# Patient Record
Sex: Male | Born: 1965 | Race: Black or African American | Hispanic: No | Marital: Married | Smoking: Current every day smoker
Health system: Southern US, Community
[De-identification: ages and names within clinical notes are randomized; demographics above are authoritative.]

## PROBLEM LIST (undated history)

## (undated) DIAGNOSIS — K859 Acute pancreatitis without necrosis or infection, unspecified: Secondary | ICD-10-CM

## (undated) HISTORY — PX: NO PAST SURGERIES: SHX2092

---

## 2013-06-11 ENCOUNTER — Emergency Department (INDEPENDENT_AMBULATORY_CARE_PROVIDER_SITE_OTHER)
Admission: EM | Admit: 2013-06-11 | Discharge: 2013-06-11 | Disposition: A | Discharge status: Home / Self Care | Payer: Self-pay | Source: Home / Self Care | Attending: Emergency Medicine | Admitting: Emergency Medicine

## 2013-06-11 ENCOUNTER — Encounter (HOSPITAL_COMMUNITY): Payer: Self-pay | Admitting: Emergency Medicine

## 2013-06-11 DIAGNOSIS — I10 Essential (primary) hypertension: Secondary | ICD-10-CM

## 2013-06-11 LAB — POCT I-STAT, CHEM 8
BUN: 10 mg/dL (ref 6–23)
Calcium, Ion: 1.02 mmol/L — ABNORMAL LOW (ref 1.12–1.23)
Chloride: 105 meq/L (ref 96–112)
Creatinine, Ser: 1.2 mg/dL (ref 0.50–1.35)
Glucose, Bld: 110 mg/dL — ABNORMAL HIGH (ref 70–99)
HCT: 53 % — ABNORMAL HIGH (ref 39.0–52.0)
Hemoglobin: 18 g/dL — ABNORMAL HIGH (ref 13.0–17.0)
Potassium: 3.7 meq/L (ref 3.7–5.3)
Sodium: 140 meq/L (ref 137–147)
TCO2: 26 mmol/L (ref 0–100)

## 2013-06-11 MED ORDER — HYDROCHLOROTHIAZIDE 25 MG PO TABS: 25.0000 mg | ORAL_TABLET | Freq: Every day | ORAL | Status: DC

## 2013-06-11 MED ORDER — AMLODIPINE BESYLATE 5 MG PO TABS
5.0000 mg | ORAL_TABLET | Freq: Every day | ORAL | Status: DC
Start: 2013-06-11 — End: 2014-06-20

## 2013-06-11 NOTE — ED Provider Notes (Signed)
Chief Complaint   Chief Complaint  Patient presents with  . 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 He was given a three-month extension to get his blood sugars under control, and is 2 months into the three-month extension, so he needs something now to get his blood pressure is controlled. He's not taking any medication right now. He denies any symptoms such as headaches, dizziness, lightheadedness, blurry vision, chest discomfort, or shortness of breath. He's had no palpitations or syncope. No extremity swelling or strokelike symptoms. He has no history of diabetes, elevated cholesterol, but he does smoke a half pack of cigarettes a day and drinks rare alcohol.  Review of Systems   Other than as noted above, the patient denies any of the following symptoms: Respiratory:  No coughing, wheezing, or shortness of breath. Cardiac:  No chest pain, tightness, pressure, palpitations, syncope, or edema. Neuro:  No headache, dizziness, blurred vision, weakness, paresthesias, or strokelike symptoms.   PMFSH   Past medical history, family history, social history, meds, and allergies were reviewed.    Physical Examination   Vital signs:  BP 162/83  Pulse 78  Temp(Src) 98.1 F (36.7 C) (Oral)  Resp 18  SpO2 98% General:  Alert, oriented, in no distress. Lungs:  Breath sounds clear and equal bilaterally.  No wheezes, rales, or rhonchi. Heart:  Regular rhythm, no gallops, murmers, clicks or rubs.  Abdomen:  Soft and flat.  Nontender, no organomegaly or mass.  No pulsatile midline abdominal mass or bruit. Ext:  No edema, pulses full. Neurological exam:  Alert and oriented.  Speech is clear.  No pronator drift.  CNs intact.  Labs   Results for orders placed during the hospital encounter of 06/11/13  POCT I-STAT, CHEM 8   Result Value Ref Range   Sodium 140  137 - 147 mEq/L   Potassium 3.7  3.7 - 5.3 mEq/L   Chloride 105  96 - 112 mEq/L   BUN 10  6 - 23 mg/dL   Creatinine, Ser 8.291.20  0.50 - 1.35 mg/dL   Glucose, Bld 562110 (*) 70 - 99 mg/dL   Calcium, Ion 1.301.02 (*) 1.12 - 1.23 mmol/L   TCO2 26  0 - 100 mmol/L   Hemoglobin 18.0 (*) 13.0 - 17.0 g/dL   HCT 86.553.0 (*) 78.439.0 - 69.652.0 %    Assessment   The encounter diagnosis was Hypertension.  He'll need a primary care physician and was given the name of the [facility] for followup within the next month.  Plan   1.  Meds:  The following meds were prescribed:   Discharge Medication List as of 06/11/2013  8:06 PM    START taking these medications   Details  amLODipine (NORVASC) 5 MG tablet Take 1 tablet (5 mg total) by mouth daily., Starting 06/11/2013, Until Discontinued, Normal    hydrochlorothiazide (HYDRODIURIL) 25 MG tablet Take 1 tablet (25 mg total) by mouth daily., Starting 06/11/2013, Until Discontinued, Normal        2.  Patient Education/Counseling:  The patient was given appropriate handouts, self care instructions, and instructed in symptomatic relief. Specifically discussed salt and sodium restriction, weight control, and exercise.   3.  Follow up:  The patient was told to follow up here if no better  in 3 to 4 days, or sooner if becoming worse in any way, and given some red flag symptoms such as severe headache, vision changes, shortness of breath, chest pain or stroke like symptoms which would prompt immediate return.      Reuben Likes, MD 06/11/13 [phone]

## 2013-06-11 NOTE — ED Notes (Signed)
Had a DOT exam about 6 weeks ago, and his BP was elevated then. 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

## 2013-06-11 NOTE — Discharge Instructions (Signed)
Blood pressure over the ideal can put you at higher risk for stroke, heart disease, and kidney failure.  For this reason, it's important to try to get your blood pressure as close as possible to the ideal. ° °The ideal blood pressure is 120/80.  Blood pressures from 120-139 systolic over 80-89 diastolic are labeled as "prehypertension."  This means you are at higher risk of developing hypertension in the future.  Blood pressures in this range are not treated with medication, but lifestyle changes are recommended to prevent progression to hypertension.  Blood pressures of 140 and above systolic over 90 and above diastolic are classified as hypertension and are treated with medications. ° °Lifestyle changes which can benefit both prehypertension and hypertension include the following: ° °· Salt and sodium restriction. °· Weight loss. °· Regular exercise. °· Avoidance of tobacco. °· Avoidance of excess alcohol. °· The "D.A.S.H" diet. ° °· People with hypertension and prehypertension should limit their salt intake to less than 1500 mg daily.  Reading the nutrition information on the label of many prepared foods can give you an idea of how much sodium you're consuming at each meal.  Remember that the most important number on the nutrition information is the serving size.  It may be smaller than you think.  Try to avoid adding extra salt at the table.  You may add small amounts of salt while cooking.  Remember that salt is an acquired taste and you may get used to a using a whole lot less salt than you are using now.  Using less salt lets the food's natural flavors come through.  You might want to consider using salt substitutes, potassium chloride, pepper, or blends of herbs and spices to enhance the flavor of your food.  Foods that contain the most salt include: processed meats (like ham, bacon, lunch meat, sausage, hot dogs, and breakfast meat), chips, pretzels, salted nuts, soups, salty snacks, canned foods, junk  food, fast food, restaurant food, mustard, pickles, pizza, popcorn, soy sauce, and worcestershire sauce--quite a list!  You might ask, "Is there anything I can eat?"  The answer is, "yes."  Fruits and vegetables are usually low in salt.  Fresh is better than frozen which is better than canned.  If you have canned vegetables, you can cut down on the salt content by rinsing them in tap water 3 times before cooking.   ° °· Weight loss is the second thing you can do to lower your blood pressure.  Getting to and maintaining ideal weight will often normalize your blood pressure and allow you to avoid medications, entirely, cut way down on your dosage of medications, or allow to wean off your meds.  (Note, this should only be done under the supervision of your primary care doctor.)  Of course, weight loss takes time and you may need to be on medication in the meantime.  You shoot for a body mass index of 20-25.  When you go to the urgent care or to your primary care doctor, they should calculate your BMI.  If you don't know what it is, ask.  You can calculate your BMI with the following formula:  Weight in pounds x 703/ (height in inches) x (height in inches).  There are many good diets out there: Weight Watchers and the D.A.S.H. Diet are the best, but often, just modifying a few factors can be helpful:  Don't skip meals, don't eat out, and keeping a food diary.  I do not recommend   fad diets or diet pills which often raise blood pressure.  ° °· Everyone should get regular exercise, but this is particularly important for people with high blood pressure.  Just about any exercise is good.  The only exercise which may be harmful is lifting extreme heavy weights.  I recommend moderate exercise such as walking for 30 minutes 5 days a week.  Going to the gym for a 50 minute workout 3 times a week is also good.  This amounts to 150 minutes of exercise weekly. ° °· Anyone with high blood pressure should avoid any use of tobacco.   Tobacco use does not elevate blood pressure, but it increases the risk of heart disease and stroke.  If you are interested in quitting, discuss with your doctor how to quit.  If you are not interested in quitting, ask yourself, "What would my life be like in 10 years if I continue to smoke?"  "How will I know when it is time to quit?"  "How would my life be better if I were to quit." ° °· Excess alcohol intake can raise the blood pressure.  The safe alcohol intake is 2 drinks or less per day for men and 1 drink per day or less for women. ° °· There is a very good diet which I recommend that has been designed for people with blood pressure called the D.A.S.H. Diet (dietary approaches to stop hypertension).  It consists of fruits, vegetables, lean meats, low fat dairy, whole grains, nuts and seeds.  It is very low in salt and sodium.  It has also been found to have other beneficial health effects such as lowering cholesterol and helping lose weight.  It has been developed by the National [employer] of [employer] and can be downloaded from the internet without any cost. Just do a web search on "D.A.S.H. Diet." or go the NIH website (www.nih.gov).  There are also cookbooks and diet plans that can be gotten from [employer] to help you with this diet. °·  °

## 2013-06-28 ENCOUNTER — Ambulatory Visit: Payer: Self-pay | Admitting: Emergency Medicine

## 2013-06-28 VITALS — BP 118/78 | HR 78 | Temp 97.8°F | Resp 18 | Ht 68.5 in | Wt 243.2 lb

## 2013-06-28 DIAGNOSIS — Z0000 Encounter for general adult medical examination without abnormal findings: Secondary | ICD-10-CM

## 2013-06-28 DIAGNOSIS — I10 Essential (primary) hypertension: Secondary | ICD-10-CM

## 2013-06-28 NOTE — Progress Notes (Signed)
   Subjective:    Patient ID: Tommy Payne, male    DOB: February 05, 1966, 48 y.o.   MRN: [id number]  HPI Chief Complaint  Patient presents with  . Annual Exam    DOT exam   This chart was scribed for Lesle ChrisSteven Keiarra Charon, MD by Andrew Auaven Small, ED Scribe. This patient was seen in room 8 and the patient's care was started at 3:02 PM.  HPI Comments: Tommy Payne is a 48 y.o. male who presents to the [facility] requesting a  DOT physical. Pt denies any medical issues. Pt reports that he has been taking norvasc and hydrodiuril for HTN for about 1 month. He reports otherwise he is healthy.  No past medical history on file. No Known Allergies Prior to Admission medications   Medication Sig Start Date End Date Taking? Authorizing Provider  amLODipine (NORVASC) 5 MG tablet Take 1 tablet (5 mg total) by mouth daily. 06/11/13  Yes Reuben Likesavid C Keller, MD  hydrochlorothiazide (HYDRODIURIL) 25 MG tablet Take 1 tablet (25 mg total) by mouth daily. 06/11/13  Yes Reuben Likesavid C Keller, MD    Review of Systems     Objective:   Physical Exam CONSTITUTIONAL: African American male Well developed/well nourished/non-distressed HEAD: Normocephalic/atraumatic. Gold upper incisors EYES: EOMI/PERRL ENMT: Mucous membranes moist NECK: supple no meningeal signs SPINE:entire spine nontender CV: S1/S2 noted, no murmurs/rubs/gallops noted LUNGS: Lungs are clear to auscultation bilaterally, no apparent distress ABDOMEN: soft, nontender, no rebound or guarding GU:no cva tenderness. 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 He is currently on a diuretic and amlodipine and has normal blood pressure on his check today. I personally performed the services described in this documentation, which was scribed  in my presence. The recorded information has been reviewed and is accurate.

## 2014-06-20 ENCOUNTER — Ambulatory Visit (INDEPENDENT_AMBULATORY_CARE_PROVIDER_SITE_OTHER): Payer: Self-pay | Admitting: Physician Assistant

## 2014-06-20 VITALS — BP 134/80 | HR 81 | Temp 97.9°F | Resp 18 | Ht 67.0 in | Wt 248.0 lb

## 2014-06-20 DIAGNOSIS — Z029 Encounter for administrative examinations, unspecified: Secondary | ICD-10-CM

## 2014-06-20 DIAGNOSIS — Z024 Encounter for examination for driving license: Secondary | ICD-10-CM

## 2014-06-20 NOTE — Progress Notes (Signed)
This patient presents for DOT examination for fitness for duty.  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  Medical History: no  Any illness or injury in the last 5 years? no  Head/Brain Injuries, disorders or illnesses no  Seizures, epilepsy no  Eye disorders or impaired vision  no  Ear disorders, loss of hearing or balance no  Heart disease or heart attack; other cardiovascular condition no  Heart surgery (valve replacement/bypass, angioplasty, pacemaker) no  High blood pressure no  Muscular disease no  Shortness of breath no  Lung disease, emphysema, asthma, chronic bronchitis no  Kidney disease, dialysis no  Liver disease no  Digestive problems no  Diabetes or elevated blood sugar no  Nervious or psychiatric disorders, e.g., severe depression no  Loss of, or altered consciousness no  Fainting, dizziness no  Sleep disorders, pauses in breathing while asleep, daytime sleepiness, loud snoring no  Stroke or paralysis no  Missing or impaired hand, arm, foot, leg, finger, toe no  Spinal injury or disease no  Chronic low back pain no  Regular, frequent alcohol use no  Narcotic or habit forming drug use  Current Medications: Prior to Admission medications   Medication Sig Start Date End Date Taking? Authorizing Provider  amLODipine (NORVASC) 5 MG tablet Take 1 tablet (5 mg total) by mouth daily. Patient not taking: Reported on 06/20/2014 06/11/13   Reuben Likesavid C Keller, MD  hydrochlorothiazide (HYDRODIURIL) 25 MG tablet Take 1 tablet (25 mg total) by mouth daily. Patient not taking: Reported on 06/20/2014 06/11/13   Reuben Likesavid C Keller, MD    Primary Care Provider: none Specialists:   Medical Examiner's Comments on Health History:    TESTING:   Visual Acuity Screening   Right eye Left eye Both eyes  Without correction: 20/15 -2 20/20 20/15 -1  With correction:       Monocular Vision: No.  Hearing Aid used for test: No. Hearing Aid required to to meet standard: No. Distance from  individual at which forced whispered voice can first be heard:   RIGHT ear 10 feet; LEFT ear 10 feet   BP 134/80 mmHg  Pulse 81  Temp(Src) 97.9 F (36.6 C) (Oral)  Resp 18  Ht 5\' 7"  (1.702 m)  Wt 248 lb (112.492 kg)  BMI 38.83 kg/m2  SpO2 98% Pulse rate is regular  Urine Specimen: Specific Gravity 1.010, Protein trace, Blood neg, Sugar neg   PHYSICAL EXAMINATION:  1. No. General Appearance: Marked overweight, tremor, signs of alcoholism, problem drinking or drug abuse. 2. No. Eyes: pupillary equality, reaction to light, accommodation, ocular motility, ocular muscle imbalance, extra ocular movement, nystagmus, exopthalmos. Ask about retinopathy, cataracts, aphakia, glaucoma, macular degeneration and refer to a specialist if appropriate.  3. No. Ears: Scarring of tympanic membrane, occlusion of external canal, perforated eardrums.     4. No. Mouth and Throat: Irremedial deformities likely to interfere with breathing or swallowing.    5. No. Heart: Murmurs, extra sounds, enlarged heart, pacemaker, implantable defibrillator.     6. No. Lungs and Chest, not including breast examination: Abnormal Chest wall expansion, abnormal respiratory rate, abnormal breath sounds including wheezes or alveolar rates, impaired respiratory function, cyanosis. Abnormal findings on physical exam may require further testing such as pulmonary tests and/or x ray of chest.  7. No. Abdomen and Viscera: Enlarged liver, enlarged spleen, masses, bruits, hernia, significant abdominal wall muscle weakness.  8. No. Vascular System: Abnormal pulse and amplitude, carotid or arterial bruits, varicose veins.    9.  No. Genitourinary System: Hernia  10. No. Extremities-Limb impaired: Loss or impairment of leg, foot, toe, arm, hand, finger. Perceptible limp, deformities, atrophy, weakness, paralysis, clubbing, edema, hypotonia. Insufficient grasp and prehension to maintain steering wheel grip. Insufficient mobility and strength  in lower limb to operate pedals properly. 11. No. Spine, other musculoskeletal: Previous surgery, deformities, limitation of motion, tenderness.  12. No. Neurological: Impaired equilibrium, coordination or speech pattern; paresthesia, asymmetric deep tendon reflexes, sensory or positional abnormalities, abnormal patellar and Babinski's reflexes, ataxia.     Comments:   Certification Status: does meet standards for 2 year certificate.    Wearing corrective lenses: no Wearing hearing aid: no Accompanied by a no waiver/exemption Skill performance Evaluation (SPE) Certificate: no Driving within an exempt intracity zone: no Qualified by operation of 49 CFR 391.64: no  Certification expires 06/19/2016

## 2015-03-21 ENCOUNTER — Inpatient Hospital Stay (HOSPITAL_COMMUNITY)
Admission: EM | Admit: 2015-03-21 | Discharge: 2015-03-27 | DRG: 419 | Disposition: A | Discharge status: Home / Self Care | Payer: No Typology Code available for payment source | Attending: Internal Medicine | Admitting: Internal Medicine

## 2015-03-21 ENCOUNTER — Emergency Department (HOSPITAL_COMMUNITY): Payer: No Typology Code available for payment source

## 2015-03-21 ENCOUNTER — Encounter (HOSPITAL_COMMUNITY): Payer: Self-pay | Admitting: *Deleted

## 2015-03-21 DIAGNOSIS — K851 Biliary acute pancreatitis without necrosis or infection: Principal | ICD-10-CM | POA: Diagnosis present

## 2015-03-21 DIAGNOSIS — K8050 Calculus of bile duct without cholangitis or cholecystitis without obstruction: Secondary | ICD-10-CM | POA: Diagnosis present

## 2015-03-21 DIAGNOSIS — R030 Elevated blood-pressure reading, without diagnosis of hypertension: Secondary | ICD-10-CM

## 2015-03-21 DIAGNOSIS — Z8249 Family history of ischemic heart disease and other diseases of the circulatory system: Secondary | ICD-10-CM | POA: Diagnosis not present

## 2015-03-21 DIAGNOSIS — K8070 Calculus of gallbladder and bile duct without cholecystitis without obstruction: Secondary | ICD-10-CM | POA: Diagnosis present

## 2015-03-21 DIAGNOSIS — F17210 Nicotine dependence, cigarettes, uncomplicated: Secondary | ICD-10-CM | POA: Diagnosis present

## 2015-03-21 DIAGNOSIS — K859 Acute pancreatitis without necrosis or infection, unspecified: Secondary | ICD-10-CM

## 2015-03-21 DIAGNOSIS — Z79899 Other long term (current) drug therapy: Secondary | ICD-10-CM

## 2015-03-21 DIAGNOSIS — IMO0001 Reserved for inherently not codable concepts without codable children: Secondary | ICD-10-CM | POA: Diagnosis present

## 2015-03-21 DIAGNOSIS — I10 Essential (primary) hypertension: Secondary | ICD-10-CM | POA: Diagnosis present

## 2015-03-21 DIAGNOSIS — K858 Other acute pancreatitis without necrosis or infection: Secondary | ICD-10-CM | POA: Diagnosis not present

## 2015-03-21 DIAGNOSIS — R17 Unspecified jaundice: Secondary | ICD-10-CM

## 2015-03-21 HISTORY — DX: Acute pancreatitis without necrosis or infection, unspecified: K85.90

## 2015-03-21 LAB — COMPREHENSIVE METABOLIC PANEL
ALT: 214 U/L — AB (ref 17–63)
AST: 123 U/L — ABNORMAL HIGH (ref 15–41)
Albumin: 4 g/dL (ref 3.5–5.0)
Alkaline Phosphatase: 288 U/L — ABNORMAL HIGH (ref 38–126)
Anion gap: 9 (ref 5–15)
BILIRUBIN TOTAL: 8.3 mg/dL — AB (ref 0.3–1.2)
BUN: 9 mg/dL (ref 6–20)
CO2: 28 mmol/L (ref 22–32)
CREATININE: 1.22 mg/dL (ref 0.61–1.24)
Calcium: 9.6 mg/dL (ref 8.9–10.3)
Chloride: 101 mmol/L (ref 101–111)
GFR calc Af Amer: >60 mL/min (ref >60)
Glucose, Bld: 128 mg/dL — ABNORMAL HIGH (ref 65–99)
Potassium: 3.8 mmol/L (ref 3.5–5.1)
Sodium: 138 mmol/L (ref 135–145)
TOTAL PROTEIN: 7.9 g/dL (ref 6.5–8.1)

## 2015-03-21 LAB — POC OCCULT BLOOD, ED: Fecal Occult Bld: NEGATIVE

## 2015-03-21 LAB — CBC
HCT: 43.7 % (ref 39.0–52.0)
Hemoglobin: 15 g/dL (ref 13.0–17.0)
MCH: 27.2 pg (ref 26.0–34.0)
MCHC: 34.3 g/dL (ref 30.0–36.0)
MCV: 79.3 fL (ref 78.0–100.0)
PLATELETS: 283 10*3/uL (ref 150–400)
RBC: 5.51 MIL/uL (ref 4.22–5.81)
RDW: 14.5 % (ref 11.5–15.5)
WBC: 8.4 10*3/uL (ref 4.0–10.5)

## 2015-03-21 LAB — LIPASE, BLOOD: Lipase: 1861 U/L — ABNORMAL HIGH (ref 11–51)

## 2015-03-21 MED ORDER — HYDRALAZINE HCL 20 MG/ML IJ SOLN
10.0000 mg | INTRAMUSCULAR | Status: DC | PRN
  Administered 2015-03-22 – 2015-03-26 (×5): 10 mg via INTRAVENOUS
  Filled 2015-03-21 (×5): qty 1

## 2015-03-21 MED ORDER — SODIUM CHLORIDE 0.9 % IV BOLUS (SEPSIS)
1000.0000 mL | Freq: Once | INTRAVENOUS | Status: AC
  Administered 2015-03-21: 1000 mL via INTRAVENOUS

## 2015-03-21 MED ORDER — SODIUM CHLORIDE 0.9 % IV SOLN: INTRAVENOUS | Status: DC

## 2015-03-21 MED ORDER — IOHEXOL 300 MG/ML  SOLN
25.0000 mL | Freq: Once | INTRAMUSCULAR | Status: AC | PRN
  Administered 2015-03-21: 25 mL via ORAL

## 2015-03-21 MED ORDER — ONDANSETRON HCL 4 MG PO TABS: 4.0000 mg | ORAL_TABLET | Freq: Four times a day (QID) | ORAL | Status: DC | PRN

## 2015-03-21 MED ORDER — MORPHINE SULFATE (PF) 2 MG/ML IV SOLN
1.0000 mg | INTRAVENOUS | Status: DC | PRN
  Administered 2015-03-21 – 2015-03-22 (×3): 1 mg via INTRAVENOUS
  Filled 2015-03-21 (×3): qty 1

## 2015-03-21 MED ORDER — IOHEXOL 300 MG/ML  SOLN
100.0000 mL | Freq: Once | INTRAMUSCULAR | Status: AC | PRN
  Administered 2015-03-21: 100 mL via INTRAVENOUS

## 2015-03-21 MED ORDER — ONDANSETRON HCL 4 MG/2ML IJ SOLN
4.0000 mg | Freq: Four times a day (QID) | INTRAMUSCULAR | Status: DC | PRN
  Administered 2015-03-25: 4 mg via INTRAVENOUS
  Filled 2015-03-21: qty 2

## 2015-03-21 MED ORDER — ACETAMINOPHEN 650 MG RE SUPP: 650.0000 mg | Freq: Four times a day (QID) | RECTAL | Status: DC | PRN

## 2015-03-21 MED ORDER — SODIUM CHLORIDE 0.9 % IV SOLN
INTRAVENOUS | Status: AC
  Administered 2015-03-21 – 2015-03-22 (×2): via INTRAVENOUS

## 2015-03-21 MED ORDER — PIPERACILLIN-TAZOBACTAM 3.375 G IVPB
3.3750 g | Freq: Three times a day (TID) | INTRAVENOUS | Status: DC
  Administered 2015-03-21 – 2015-03-23 (×5): 3.375 g via INTRAVENOUS
  Filled 2015-03-21 (×7): qty 50

## 2015-03-21 MED ORDER — MORPHINE SULFATE (PF) 4 MG/ML IV SOLN
4.0000 mg | Freq: Once | INTRAVENOUS | Status: AC
  Administered 2015-03-21: 4 mg via INTRAVENOUS
  Filled 2015-03-21: qty 1

## 2015-03-21 MED ORDER — ACETAMINOPHEN 325 MG PO TABS
650.0000 mg | ORAL_TABLET | Freq: Four times a day (QID) | ORAL | Status: DC | PRN
  Administered 2015-03-26: 650 mg via ORAL
  Filled 2015-03-21: qty 2

## 2015-03-21 MED ORDER — ONDANSETRON HCL 4 MG/2ML IJ SOLN
4.0000 mg | Freq: Once | INTRAMUSCULAR | Status: AC
  Administered 2015-03-21: 4 mg via INTRAVENOUS
  Filled 2015-03-21: qty 2

## 2015-03-21 NOTE — H&P (Signed)
[facility] History and Physical  Adonai Selsor ZOX:[id number] DOB: 10-Jun-1965 DOA: 03/21/2015  Referring physician: Dr.Oni. PCP: No PCP Per Patient  Specialists: None.  Chief Complaint: Abdominal pain.  HPI: Tommy Payne is a 49 y.o. male with previous history of hypertension presently on no medications presents to the ER because of persistent abdominal pain. Patient has been a abdominal pain over the last 3-4 days. Pain is mostly in the epigastric area with nausea denies any vomiting. Patient has had a bowel movement this morning which was normal as per the patient. Patient's pain increases on eating. In the ER labs reveal a markedly elevated lipase and elevated LFTs. Sonogram of the abdomen shows gallbladder stones with CBD dilatation. CT abdomen and pelvis shows features consistent with acute pancreatitis with possible inflammatory changes in the biliary tree. On-call gastroenterologist Dr. Randa Evens was consulted by ER physician and patient has been admitted for acute pancreatitis with possible cholangitis. Denies any chest pain or shortness of breath.   Review of Systems: As presented in the history of presenting illness, rest negative.  Past Medical History  Diagnosis Date  . Medical history non-contributory    Past Surgical History  Procedure Laterality Date  . No past surgeries     Social History:  reports that he has been smoking Cigarettes.  He has been smoking about 0.50 packs per day. He does not have any smokeless tobacco history on file. He reports that he drinks alcohol. He reports that he does not use illicit drugs. Where does patient live home. Can patient participate in ADLs? Yes.  No Known Allergies  Family History:  Family History  Problem Relation Age of Onset  . Hypertension Maternal Grandfather       Prior to Admission medications   Medication Sig Start Date End Date Taking? Authorizing Provider  acetaminophen (TYLENOL) 500 MG tablet Take 1,000 mg by  mouth every 6 (six) hours as needed for mild pain or headache.   Yes Historical Provider, MD  bismuth subsalicylate (PEPTO BISMOL) 262 MG/15ML suspension Take 30 mLs by mouth every 6 (six) hours as needed for indigestion or diarrhea or loose stools.   Yes Historical Provider, MD  magnesium hydroxide (MILK OF MAGNESIA) 400 MG/5ML suspension Take 30 mLs by mouth daily as needed for mild constipation.   Yes Historical Provider, MD    Physical Exam: Filed Vitals:   03/21/15 1830 03/21/15 1934 03/21/15 2000 03/21/15 2038  BP: 137/73 151/81 166/95 161/87  Pulse: 78  61 61  Temp:    99.3 F (37.4 C)  TempSrc:    Oral  Resp:    20  Height:     (1.727 m)  Weight:    112.084 kg (247 lb 1.6 oz)  SpO2: 100%  96% 100%     General:  Moderately built and nourished.  Eyes: Anicteric no pallor.  ENT: No discharge from the ears eyes nose and mouth.  Neck: No mass felt. No neck rigidity.  Cardiovascular: S1 and S2 heard.  Respiratory: No rhonchi or crepitations.  Abdomen: Soft nontender bowel sounds present.  Skin: No rash.  Musculoskeletal: No edema.  Psychiatric: Appears normal.  Neurologic: Alert awake oriented to time place and person. Moves all extremities.  Labs on Admission:  Basic Metabolic Panel:  Recent Labs Lab 03/21/15 1355  NA 138  K 3.8  CL 101  CO2 28  GLUCOSE 128*  BUN 9  CREATININE 1.22  CALCIUM 9.6   Liver Function Tests:  Recent Labs Lab  03/21/15 1355  AST 123*  ALT 214*  ALKPHOS 288*  BILITOT 8.3*  PROT 7.9  ALBUMIN 4.0    Recent Labs Lab 03/21/15 1355  LIPASE 1861*   No results for input(s): AMMONIA in the last 168 hours. CBC:  Recent Labs Lab 03/21/15 1355  WBC 8.4  HGB 15.0  HCT 43.7  MCV 79.3  PLT 283   Cardiac Enzymes: No results for input(s): CKTOTAL, CKMB, CKMBINDEX, TROPONINI in the last 168 hours.  BNP (last 3 results) No results for input(s): BNP in the last 8760 hours.  ProBNP (last 3 results) No results  for input(s): PROBNP in the last 8760 hours.  CBG: No results for input(s): GLUCAP in the last 168 hours.  Radiological Exams on Admission: Ct Abdomen Pelvis W Contrast  03/21/2015  CLINICAL DATA:  49 year old male with lower abdominal pain and diaphoresis. No nausea or vomiting. EXAM: CT ABDOMEN AND PELVIS WITH CONTRAST TECHNIQUE: Multidetector CT imaging of the abdomen and pelvis was performed using the standard protocol following bolus administration of intravenous contrast. CONTRAST:  100mL OMNIPAQUE IOHEXOL 300 MG/ML  SOLN COMPARISON:  Ultrasound dated 03/21/2015 FINDINGS: Minimal linear bibasilar dependent subsegmental atelectatic changes. The lung bases are otherwise clear. Top-normal cardiac size. No intra-abdominal free air or free fluid. The liver is unremarkable. There is faint haziness within the gallbladder fundus which may correspond to the stone seen on the ultrasound. There is mild dilatation of the CBD measuring up to 12 mm in diameter with normal distal tapering at the head of the pancreas. There is haziness of the wall of the biliary tree with haziness of adjacent fat. This is likely related to extension of the inflammatory changes of the pancreas. Sign An inflammatory/infectious etiology involving the gallbladder or biliary tree is not excluded. Clinical correlation is recommended. There is mild peripancreatic haziness. This finding in combination with elevated lipase level is compatible with acute pancreatitis. No drainable fluid collection/abscess or pseudocyst identified. The spleen, and the left adrenal gland appear unremarkable. There is a 2.7 x 2.6 cm indeterminate heterogeneously enhancing right adrenal nodule. MRI is recommended for further characterization. There is a 1.3 cm nonobstructing left renal lower pole calculus. No hydronephrosis. The right kidney appears unremarkable. The visualized ureters and urinary bladder appear unremarkable. The prostate and seminal vesicles are  grossly unremarkable. There is sigmoid diverticulosis with muscular hypertrophy. No active inflammation. There is apparent diffuse thickening of the distal colon, likely related to underdistention. Colitis is less likely but not excluded. Clinical correlation is recommended. No evidence of bowel obstruction. Normal appendix. The abdominal aorta and IVC appear unremarkable. No portal venous gas identified. There is no adenopathy. There is a small fat containing umbilical hernia. The abdominal wall soft tissues appear unremarkable. The osseous structures are intact. IMPRESSION: Findings most compatible with acute pancreatitis.  No abscess. Mild haziness of the biliary trees likely extension of inflammation of the pancreas. An inflammatory/infectious involving the biliary tree as the primary pathology is less likely but not excluded. Clinical correlation is recommended. Indeterminate right adrenal nodule. MRI is recommended further characterization. Nonobstructing left renal inferior pole calculus. Sigmoid diverticulosis. Electronically Signed   By: Elgie CollardArash  Radparvar M.D.   On: 03/21/2015 19:45   [place] Abdomen Limited  03/21/2015  CLINICAL DATA:  Abdominal pain. Pancreatitis. Rule out cholecystitis. EXAM: US ABDOMEN LIMITED - RIGHT UPPER QUADRANT COMPARISON:  None. FINDINGS: Gallbladder: Bold contracted gallbladder filled with gallstones. Negative sonographic Murphy sign. Gallbladder wall not thickened 2.5 mm Common bile duct: Diameter: 7.7  mm.  Mild common bile duct dilatation. Liver: No focal liver lesion.  Mild ductal dilatation IMPRESSION: Cholelithiasis without evidence of gallbladder wall thickening or pain over the gallbladder Mild biliary dilatation. Correlate with liver function tests. Common bile duct stone not excluded. Electronically Signed   By: Marlan Palau M.D.   On: 03/21/2015 18:39     Assessment/Plan Principal Problem:   Pancreatitis, acute Active Problems:   Choledocholithiasis   Elevated  blood pressure   Acute pancreatitis   1. Acute pancreatitis most likely secondary to gallstone pancreatitis with possible CBD obstruction - on call gastroenterologist Dr. Randa Evens was consulted by ER physician and will be seeing patient in consult. Patient will be kept nothing by mouth in anticipation of possible ERCP. Follow LFTs and lipase. Patient has been kept nothing by mouth. Check triglyceride levels. Patient states he drinks alcohol only occasionally and last drink was 2 weeks ago. Continued pain relief medications and hydration. 2. Possible cholangitis - I have placed patient on Zosyn. Follow LFTs and fever pattern. 3. History of hypertension presently has elevated blood pressure I have placed patient on when necessary IV hydralazine.   DVT Prophylaxis SCD in anticipation of procedure.  Code Status: Full code.  Family Communication: Patient's son.  Disposition Plan: Admit to inpatient.    Burley Kopka N. [facility] Pager [phone].  If 7PM-7AM, please contact night-coverage [email or web address] Password [facility] 03/21/2015, 9:26 PM

## 2015-03-21 NOTE — ED Provider Notes (Addendum)
CSN: [id number]     Arrival date & time 03/21/15  1322 History   First MD Initiated Contact with Patient 03/21/15 1636     Chief Complaint  Patient presents with  . Abdominal Pain     (Consider location/radiation/quality/duration/timing/severity/associated sxs/prior Treatment) The history is provided by the patient.  Tommy Payne is a 49 y.o. male here presenting with epigastric pain. Epigastric pain worsening for the last 2 days. Some nausea and vomiting and subjective chills. he denies any fevers. He states that His urine has turned a large color and his stool was very dark but he did take some Pepto-Bismol. He states that he did drink alcohol but last alcohol use was 2 weeks ago. Denies any history of gallstones.   History reviewed. No pertinent past medical history. History reviewed. No pertinent past surgical history. No family history on file. Social History  Substance Use Topics  . Smoking status: Current Every Day Smoker -- 0.50 packs/day    Types: Cigarettes    Last Attempt to Quit: 03/27/2014  . Smokeless tobacco: None  . Alcohol Use: 0.0 oz/week    0 Standard drinks or equivalent per week     Comment: occ    Review of Systems  Gastrointestinal: Positive for nausea and abdominal pain.  All other systems reviewed and are negative.     Allergies  Review of patient's allergies indicates no known allergies.  Home Medications   Prior to Admission medications   Not on File   BP 154/82 mmHg  Pulse 64  Temp(Src) 98 F (36.7 C) (Oral)  Resp 16  Ht 5' 8"  (1.727 m)  Wt 250 lb (113.399 kg)  BMI 38.02 kg/m2  SpO2 96% Physical Exam  Constitutional: He is oriented to person, place, and time.  Uncomfortable   HENT:  Head: Normocephalic.  MM dry   Eyes: Conjunctivae are normal. Pupils are equal, round, and reactive to light.  Neck: Normal range of motion.  Cardiovascular: Normal rate, regular rhythm and normal heart sounds.   Pulmonary/Chest: Effort normal and  breath sounds normal.  Abdominal:  + epigastric and RUQ tenderness, no rebound   Musculoskeletal: Normal range of motion. He exhibits no edema or tenderness.  Neurological: He is alert and oriented to person, place, and time. No cranial nerve deficit. Coordination normal.  Skin: Skin is warm.  Psychiatric: He has a normal mood and affect. His behavior is normal. Judgment and thought content normal.  Nursing note and vitals reviewed.   ED Course  Procedures (including critical care time)  Angiocath insertion Performed by: Darl Householder, DAVID  Consent: Verbal consent obtained. Risks and benefits: risks, benefits and alternatives were discussed Time out: Immediately prior to procedure a "time out" was called to verify the correct patient, procedure, equipment, support staff and site/side marked as required.  Preparation: Patient was prepped and draped in the usual sterile fashion.  Vein Location: L brachial  Ultrasound Guided  Gauge: 20 long   Normal blood return and flush without difficulty Patient tolerance: Patient tolerated the procedure well with no immediate complications.     Labs Review Labs Reviewed  LIPASE, BLOOD - Abnormal; Notable for the following:    Lipase 1861 (*)    All other components within normal limits  COMPREHENSIVE METABOLIC PANEL - Abnormal; Notable for the following:    Glucose, Bld 128 (*)    AST 123 (*)    ALT 214 (*)    Alkaline Phosphatase 288 (*)    Total Bilirubin 8.3 (*)  All other components within normal limits  CBC  URINALYSIS, ROUTINE W REFLEX MICROSCOPIC (NOT AT [facility])  POC OCCULT BLOOD, ED    Imaging Review US Abdomen Limited  03/21/2015  CLINICAL DATA:  Abdominal pain. Pancreatitis. Rule out cholecystitis. EXAM: US ABDOMEN LIMITED - RIGHT UPPER QUADRANT COMPARISON:  None. FINDINGS: Gallbladder: Bold contracted gallbladder filled with gallstones. Negative sonographic Murphy sign. Gallbladder wall not thickened 2.5 mm Common bile duct:  Diameter: 7.7 mm.  Mild common bile duct dilatation. Liver: No focal liver lesion.  Mild ductal dilatation IMPRESSION: Cholelithiasis without evidence of gallbladder wall thickening or pain over the gallbladder Mild biliary dilatation. Correlate with liver function tests. Common bile duct stone not excluded. Electronically Signed   By: Franchot Gallo M.D.   On: 03/21/2015 18:39   I have personally reviewed and evaluated these images and lab results as part of my medical decision-making.   EKG Interpretation   Date/Time:  Sunday March 21 2015 13:32:31 EST Ventricular Rate:  80 PR Interval:  172 QRS Duration: 86 QT Interval:  384 QTC Calculation: 442 R Axis:   35 Text Interpretation:  Normal sinus rhythm Nonspecific T wave abnormality  Abnormal ECG No previous ECGs available Confirmed by YAO  MD, DAVID  ([id number]) on 03/21/2015 5:06:13 PM      MDM   Final diagnoses:  None    Tommy Payne is a 49 y.o. male here with epigastric pain. Consider pancreatitis vs cholecystitis. Afebrile, I doubt cholangitis. Will get RUQ [place], labs, CT ab/pel.   7:19 PM RUQ US showed dilated CBD. Bili 8. Alk pho elevated. I think likely choledocholithiasis causing pancreatitis (lipase 1800). Given 2 L NS, consulted Dr. Oletta Lamas from GI who will see patient in AM. Will admit to medicine service.      Wandra Arthurs, MD 03/21/15 Lurena Nida  Wandra Arthurs, MD 03/21/15 [phone]

## 2015-03-21 NOTE — Progress Notes (Signed)
ANTIBIOTIC CONSULT NOTE - INITIAL  Pharmacy Consult for Zosyn Indication: intra-abdominal infection  No Known Allergies  Patient Measurements: Height: 5' 8" (172.7 cm) Weight: 247 lb 1.6 oz (112.084 kg) IBW/kg (Calculated) : 68.4  Vital Signs: Temp: 99.3 F (37.4 C) (12/25 2038) Temp Source: Oral (12/25 2038) BP: 161/87 mmHg (12/25 2038) Pulse Rate: 61 (12/25 2038) Intake/Output from previous day:   Intake/Output from this shift:    Labs:  Recent Labs  03/21/15 1355  WBC 8.4  HGB 15.0  PLT 283  CREATININE 1.22   Estimated Creatinine Clearance: 89 mL/min (by C-G formula based on Cr of 1.22). No results for input(s): VANCOTROUGH, VANCOPEAK, VANCORANDOM, GENTTROUGH, GENTPEAK, GENTRANDOM, TOBRATROUGH, TOBRAPEAK, TOBRARND, AMIKACINPEAK, AMIKACINTROU, AMIKACIN in the last 72 hours.   Microbiology: No results found for this or any previous visit (from the past 720 hour(s)).  Medical History: Past Medical History  Diagnosis Date  . Medical history non-contributory     Medications:  Scheduled:  . piperacillin-tazobactam (ZOSYN)  IV  3.375 g Intravenous 3 times per day   Assessment: Tommy Payne is a 49 y.o. male here presenting with epigastric pain. RUQ US showed dilated CBD. Bili 8. Alk pho elevated. Likely choledocholithiasis causing pancreatitis. Pharmacy consulted to dose Zosyn.  Goal of Therapy:  Resolution of infection  Plan:  Zosyn 3.375 g IV q8h  Monitor renal function, WBC, temp, LOT, cultures  Joya San, PharmD Clinical Pharmacy Resident Pager # [phone] 03/21/2015 10:10 PM

## 2015-03-21 NOTE — ED Notes (Addendum)
Pt with epigastric pain, on and off again, x 2 years.  However, the past 2 days it has gotten worse and is accompanied with chills and nausea.  Pt states also that his urine is deep orange and his stool was black (however, he did take pepto bismal).  Upper abd is very tender on palpation.

## 2015-03-21 NOTE — ED Notes (Signed)
This RN and Engineer, miningrin RN attempted IV stick with blood return but unable to advance or positional. Dr. Silverio LayYao a bedside with US attempting stick.

## 2015-03-22 ENCOUNTER — Encounter (HOSPITAL_COMMUNITY): Payer: Self-pay | Admitting: *Deleted

## 2015-03-22 ENCOUNTER — Inpatient Hospital Stay (HOSPITAL_COMMUNITY): Payer: No Typology Code available for payment source

## 2015-03-22 DIAGNOSIS — K858 Other acute pancreatitis without necrosis or infection: Secondary | ICD-10-CM

## 2015-03-22 LAB — CBC
HEMATOCRIT: 41.7 % (ref 39.0–52.0)
HEMOGLOBIN: 14.2 g/dL (ref 13.0–17.0)
MCH: 26.5 pg (ref 26.0–34.0)
MCHC: 34.1 g/dL (ref 30.0–36.0)
MCV: 77.9 fL — ABNORMAL LOW (ref 78.0–100.0)
Platelets: 282 10*3/uL (ref 150–400)
RBC: 5.35 MIL/uL (ref 4.22–5.81)
RDW: 15.1 % (ref 11.5–15.5)
WBC: 14 10*3/uL — ABNORMAL HIGH (ref 4.0–10.5)

## 2015-03-22 LAB — COMPREHENSIVE METABOLIC PANEL
ALT: 165 U/L — AB (ref 17–63)
AST: 85 U/L — AB (ref 15–41)
Albumin: 3.5 g/dL (ref 3.5–5.0)
Alkaline Phosphatase: 290 U/L — ABNORMAL HIGH (ref 38–126)
Anion gap: 12 (ref 5–15)
BILIRUBIN TOTAL: 6.2 mg/dL — AB (ref 0.3–1.2)
BUN: 8 mg/dL (ref 6–20)
CO2: 21 mmol/L — ABNORMAL LOW (ref 22–32)
CREATININE: 1.17 mg/dL (ref 0.61–1.24)
Calcium: 9 mg/dL (ref 8.9–10.3)
Chloride: 105 mmol/L (ref 101–111)
GFR calc Af Amer: >60 mL/min (ref >60)
Glucose, Bld: 91 mg/dL (ref 65–99)
Potassium: 4.7 mmol/L (ref 3.5–5.1)
Sodium: 138 mmol/L (ref 135–145)
TOTAL PROTEIN: 7.2 g/dL (ref 6.5–8.1)

## 2015-03-22 LAB — LIPID PANEL
CHOL/HDL RATIO: 2.3 ratio
CHOLESTEROL: 211 mg/dL — AB (ref 0–200)
HDL: 91 mg/dL (ref >40)
LDL CALC: 105 mg/dL — AB (ref 0–99)
TRIGLYCERIDES: 73 mg/dL (ref <150)
VLDL: 15 mg/dL (ref 0–40)

## 2015-03-22 LAB — GLUCOSE, CAPILLARY
GLUCOSE-CAPILLARY: 99 mg/dL (ref 65–99)
Glucose-Capillary: 103 mg/dL — ABNORMAL HIGH (ref 65–99)
Glucose-Capillary: 103 mg/dL — ABNORMAL HIGH (ref 65–99)

## 2015-03-22 LAB — LIPASE, BLOOD: Lipase: 86 U/L — ABNORMAL HIGH (ref 11–51)

## 2015-03-22 LAB — PROTIME-INR
INR: 1 (ref 0.00–1.49)
PROTHROMBIN TIME: 13.4 s (ref 11.6–15.2)

## 2015-03-22 MED ORDER — SODIUM CHLORIDE 0.9 % IV BOLUS (SEPSIS): 1000.0000 mL | INTRAVENOUS | Status: DC | PRN

## 2015-03-22 MED ORDER — HYDROMORPHONE HCL 1 MG/ML IJ SOLN
1.0000 mg | INTRAMUSCULAR | Status: DC | PRN
  Administered 2015-03-22 – 2015-03-26 (×21): 1 mg via INTRAVENOUS
  Filled 2015-03-22 (×21): qty 1

## 2015-03-22 MED ORDER — ENOXAPARIN SODIUM 60 MG/0.6ML ~~LOC~~ SOLN
0.5000 mg/kg | SUBCUTANEOUS | Status: DC
  Administered 2015-03-24 – 2015-03-26 (×2): 55 mg via SUBCUTANEOUS
  Filled 2015-03-22 (×4): qty 0.6

## 2015-03-22 MED ORDER — GADOBENATE DIMEGLUMINE 529 MG/ML IV SOLN
20.0000 mL | Freq: Once | INTRAVENOUS | Status: AC | PRN
  Administered 2015-03-22: 20 mL via INTRAVENOUS

## 2015-03-22 NOTE — Consult Note (Signed)
[facility] CONSULT Reason for consult: Gallstone Pancreatitis Referring Physician: [facility] hospitalist. PCP: none primary gastroenterologist: none  Tommy Payne is an 49 y.o. male.  HPI: he came to the emergency room with abdominal pain that started about 5 days ago. It's in the epigastric area associated with nausea without vomiting. In the emergency room he was found to have a markedly elevated lipase and elevated LFTs with ultrasound showing gallstones and slight CBD dilatation. A scan of the abdomen was obtained and did not show any clear-cut CBD stones did show acute pancreatitis. He hasn't had fever has high count was initially normal but did bump up some. His total bilirubin has dropped from 8.3 to 6.2. Lipase over 1800 dropped to 86. WBC 8.4 on admission to 14.0. Patient remains afebrile. He is on Zosyn. He tells me he feels somewhat better but is still having some abdominal pain.  Past Medical History  Diagnosis Date  . Medical history non-contributory     Past Surgical History  Procedure Laterality Date  . No past surgeries      Family History  Problem Relation Age of Onset  . Hypertension Maternal Grandfather     Social History:  reports that he has been smoking Cigarettes.  He has been smoking about 0.50 packs per day. He does not have any smokeless tobacco history on file. He reports that he drinks alcohol. He reports that he does not use illicit drugs.  Allergies: No Known Allergies  Medications; Prior to Admission medications   Medication Sig Start Date End Date Taking? Authorizing Provider  acetaminophen (TYLENOL) 500 MG tablet Take 1,000 mg by mouth every 6 (six) hours as needed for mild pain or headache.   Yes Historical Provider, MD  bismuth subsalicylate (PEPTO BISMOL) 262 MG/15ML suspension Take 30 mLs by mouth every 6 (six) hours as needed for indigestion or diarrhea or loose stools.   Yes Historical Provider, MD  magnesium hydroxide (MILK OF MAGNESIA)  400 MG/5ML suspension Take 30 mLs by mouth daily as needed for mild constipation.   Yes Historical Provider, MD   . enoxaparin (LOVENOX) injection  0.5 mg/kg Subcutaneous Q24H  . piperacillin-tazobactam (ZOSYN)  IV  3.375 g Intravenous 3 times per day   PRN Meds acetaminophen **OR** [DISCONTINUED] acetaminophen, hydrALAZINE, HYDROmorphone (DILAUDID) injection, ondansetron **OR** ondansetron (ZOFRAN) IV, sodium chloride Results for orders placed or performed during the hospital encounter of 03/21/15 (from the past 48 hour(s))  Lipase, blood     Status: Abnormal   Collection Time: 03/21/15  1:55 PM  Result Value Ref Range   Lipase 1861 (H) 11 - 51 U/L    Comment: RESULTS CONFIRMED BY MANUAL DILUTION  Comprehensive metabolic panel     Status: Abnormal   Collection Time: 03/21/15  1:55 PM  Result Value Ref Range   Sodium 138 135 - 145 mmol/L   Potassium 3.8 3.5 - 5.1 mmol/L   Chloride 101 101 - 111 mmol/L   CO2 28 22 - 32 mmol/L   Glucose, Bld 128 (H) 65 - 99 mg/dL   BUN 9 6 - 20 mg/dL   Creatinine, Ser 1.22 0.61 - 1.24 mg/dL   Calcium 9.6 8.9 - 10.3 mg/dL   Total Protein 7.9 6.5 - 8.1 g/dL   Albumin 4.0 3.5 - 5.0 g/dL   AST 123 (H) 15 - 41 U/L   ALT 214 (H) 17 - 63 U/L   Alkaline Phosphatase 288 (H) 38 - 126 U/L   Total Bilirubin 8.3 (H) 0.3 -  1.2 mg/dL   GFR calc non Af Amer >60 >60 mL/min   GFR calc Af Amer >60 >60 mL/min    Comment: (NOTE) The eGFR has been calculated using the CKD EPI equation. This calculation has not been validated in all clinical situations. eGFR's persistently <60 mL/min signify possible Chronic Kidney Disease.    Anion gap 9 5 - 15  CBC     Status: None   Collection Time: 03/21/15  1:55 PM  Result Value Ref Range   WBC 8.4 4.0 - 10.5 K/uL   RBC 5.51 4.22 - 5.81 MIL/uL   Hemoglobin 15.0 13.0 - 17.0 g/dL   HCT 43.7 39.0 - 52.0 %   MCV 79.3 78.0 - 100.0 fL   MCH 27.2 26.0 - 34.0 pg   MCHC 34.3 30.0 - 36.0 g/dL   RDW 14.5 11.5 - 15.5 %   Platelets  283 150 - 400 K/uL  POC occult blood, ED     Status: None   Collection Time: 03/21/15  5:03 PM  Result Value Ref Range   Fecal Occult Bld NEGATIVE NEGATIVE  Glucose, capillary     Status: Abnormal   Collection Time: 03/21/15 11:51 PM  Result Value Ref Range   Glucose-Capillary 103 (H) 65 - 99 mg/dL  Lipase, blood     Status: Abnormal   Collection Time: 03/22/15  9:20 AM  Result Value Ref Range   Lipase 86 (H) 11 - 51 U/L  CBC     Status: Abnormal   Collection Time: 03/22/15  9:20 AM  Result Value Ref Range   WBC 14.0 (H) 4.0 - 10.5 K/uL   RBC 5.35 4.22 - 5.81 MIL/uL   Hemoglobin 14.2 13.0 - 17.0 g/dL   HCT 41.7 39.0 - 52.0 %   MCV 77.9 (L) 78.0 - 100.0 fL   MCH 26.5 26.0 - 34.0 pg   MCHC 34.1 30.0 - 36.0 g/dL   RDW 15.1 11.5 - 15.5 %   Platelets 282 150 - 400 K/uL  Protime-INR     Status: None   Collection Time: 03/22/15  9:20 AM  Result Value Ref Range   Prothrombin Time 13.4 11.6 - 15.2 seconds   INR 1.00 0.00 - 1.49  Comprehensive metabolic panel     Status: Abnormal   Collection Time: 03/22/15  9:20 AM  Result Value Ref Range   Sodium 138 135 - 145 mmol/L   Potassium 4.7 3.5 - 5.1 mmol/L   Chloride 105 101 - 111 mmol/L   CO2 21 (L) 22 - 32 mmol/L   Glucose, Bld 91 65 - 99 mg/dL   BUN 8 6 - 20 mg/dL   Creatinine, Ser 1.17 0.61 - 1.24 mg/dL   Calcium 9.0 8.9 - 10.3 mg/dL   Total Protein 7.2 6.5 - 8.1 g/dL   Albumin 3.5 3.5 - 5.0 g/dL   AST 85 (H) 15 - 41 U/L   ALT 165 (H) 17 - 63 U/L   Alkaline Phosphatase 290 (H) 38 - 126 U/L   Total Bilirubin 6.2 (H) 0.3 - 1.2 mg/dL   GFR calc non Af Amer >60 >60 mL/min   GFR calc Af Amer >60 >60 mL/min    Comment: (NOTE) The eGFR has been calculated using the CKD EPI equation. This calculation has not been validated in all clinical situations. eGFR's persistently <60 mL/min signify possible Chronic Kidney Disease.    Anion gap 12 5 - 15    Ct Abdomen Pelvis W Contrast  03/21/2015  CLINICAL DATA:  49 year old male with  lower abdominal pain and diaphoresis. No nausea or vomiting. EXAM: CT ABDOMEN AND PELVIS WITH CONTRAST TECHNIQUE: Multidetector CT imaging of the abdomen and pelvis was performed using the standard protocol following bolus administration of intravenous contrast. CONTRAST:  173m OMNIPAQUE IOHEXOL 300 MG/ML  SOLN COMPARISON:  Ultrasound dated 03/21/2015 FINDINGS: Minimal linear bibasilar dependent subsegmental atelectatic changes. The lung bases are otherwise clear. Top-normal cardiac size. No intra-abdominal free air or free fluid. The liver is unremarkable. There is faint haziness within the gallbladder fundus which may correspond to the stone seen on the ultrasound. There is mild dilatation of the CBD measuring up to 12 mm in diameter with normal distal tapering at the head of the pancreas. There is haziness of the wall of the biliary tree with haziness of adjacent fat. This is likely related to extension of the inflammatory changes of the pancreas. Sign An inflammatory/infectious etiology involving the gallbladder or biliary tree is not excluded. Clinical correlation is recommended. There is mild peripancreatic haziness. This finding in combination with elevated lipase level is compatible with acute pancreatitis. No drainable fluid collection/abscess or pseudocyst identified. The spleen, and the left adrenal gland appear unremarkable. There is a 2.7 x 2.6 cm indeterminate heterogeneously enhancing right adrenal nodule. MRI is recommended for further characterization. There is a 1.3 cm nonobstructing left renal lower pole calculus. No hydronephrosis. The right kidney appears unremarkable. The visualized ureters and urinary bladder appear unremarkable. The prostate and seminal vesicles are grossly unremarkable. There is sigmoid diverticulosis with muscular hypertrophy. No active inflammation. There is apparent diffuse thickening of the distal colon, likely related to underdistention. Colitis is less likely but not  excluded. Clinical correlation is recommended. No evidence of bowel obstruction. Normal appendix. The abdominal aorta and IVC appear unremarkable. No portal venous gas identified. There is no adenopathy. There is a small fat containing umbilical hernia. The abdominal wall soft tissues appear unremarkable. The osseous structures are intact. IMPRESSION: Findings most compatible with acute pancreatitis.  No abscess. Mild haziness of the biliary trees likely extension of inflammation of the pancreas. An inflammatory/infectious involving the biliary tree as the primary pathology is less likely but not excluded. Clinical correlation is recommended. Indeterminate right adrenal nodule. MRI is recommended further characterization. Nonobstructing left renal inferior pole calculus. Sigmoid diverticulosis. Electronically Signed   By: AAnner CreteM.D.   On: 03/21/2015 19:45   [place]dom[place] Limited  03/21/2015  CLINICAL DATA:  Abdominal pain. Pancreatitis. Rule out cholecystitis. EXAM: [place] LIMITED - RIGHT UPPER QUADRANT COMPARISON:  None. FINDINGS: Gallbladder: Bold contracted gallbladder filled with gallstones. Negative sonographic Murphy sign. Gallbladder wall not thickened 2.5 mm Common bile duct: Diameter: 7.7 mm.  Mild common bile duct dilatation. Liver: No focal liver lesion.  Mild ductal dilatation IMPRESSION: Cholelithiasis without evidence of gallbladder wall thickening or pain over the gallbladder Mild biliary dilatation. Correlate with liver function tests. Common bile duct stone not excluded. Electronically Signed   By: CFranchot GalloM.D.   On: 03/21/2015 18:39              Blood pressure 166/89, pulse 66, temperature 99 F (37.2 C), temperature source Oral, resp. rate 16, height 5' 8" (1.727 m), weight 112.084 kg (247 lb 1.6 oz), SpO2 98 %.  Physical exam:   General-- slightly obese male in no acute distress ENT-- slightly icteric Neck-- the lymphadenopathy Heart-- regular rate and  rhythm without murmurs or gallops Lungs-- clear Abdomen-- slight distention with few  bowel sounds present Psych-- alert and oriented and appears appropriate   Assessment: 1. Gallstone pancreatitis. Review the CT scan there is no clear CBD stone with a fairly smooth taper down into the swollen pancreas. Lipase is improving and bilirubin is beginning to drop some. It may well be that he did pass CBD stone but do have to be concerned about retained stones as well. He does have cholelithiasis.  Plan: 1. Agree with continued antibiotics. We will keep him NPO for now and will obtain MRCP to evaluate for retaining CBD staff. MRCP negative, will hold off on ERCP for now. We continue on antibiotics. Have discussed with the patient the possible need for ERCP and have discussed the procedure risk in case this is needed.   EDWARDS JR,JAMES L 03/22/2015, 11:33 AM   Pager: [phone] If no answer or after hours call [phone]

## 2015-03-22 NOTE — Progress Notes (Signed)
Pt c/o pain, unrelieved by ordered morphine. Morphine given at 0900. Made Dr Thedore MinsSingh aware. Waiting to hear back from dr. Stann MainlandWill continue to monitor pt. Bed remains in lowest position and call bell is within reach.

## 2015-03-22 NOTE — Care Management Note (Signed)
Case Management Note  Patient Details  Name: Tommy Payne MRN: [id number] Date of Birth: [birth year]  Subjective/Objective:                    Action/Plan: Patient was admitted with abdominal pain, pancreatitis. Currently listed as self-pay. Per patient's account note from admitting, he reports having insurance. CM will continue to follow for discharge needs.  Expected Discharge Date:                  Expected Discharge Plan:     In-House Referral:     Discharge planning Services     Post Acute Care Choice:    Choice offered to:     DME Arranged:    DME Agency:     HH Arranged:    HH Agency:     Status of Service:  In process, will continue to follow  Medicare Important Message Given:    Date Medicare IM Given:    Medicare IM give by:    Date Additional Medicare IM Given:    Additional Medicare Important Message give by:     If discussed at Long Length of Stay Meetings, dates discussed:    Additional CommentsAnda Kraft:  Lliam Hoh C, RN 03/22/2015, 1:17 PM [phone]

## 2015-03-22 NOTE — Consult Note (Signed)
Reason for Consult: biliary pancreatitis  Referring Physician: Dr. Lala Lund   HPI: Tommy Payne is a 49 year old male with a history of hypertension who presented yesterday with a 4 day history of upper abdominal pain which started suddenly on Wednesday afternoon while he was working outside.  He reports previous symptoms about 10 years ago, but nothing in recent years.  Denies post prandial symptoms.  Severe in severity.  Characterized as sharp, burning pain.  Aggravated by oral intake.  No alleviating factors.  modifying factors include; pepto bismol.  Pain persisted and he came to the ED.  Work up showed lipase 1861.  T bili 8.3.  Abdominal US showed cholelithiasis without gallbladder wall thickening, mild biliary dilatation.   Occasional alcohol use 1/2ppd smoker Denies previous surgeries.  Past Medical History  Diagnosis Date  . Medical history non-contributory     Past Surgical History  Procedure Laterality Date  . No past surgeries      Family History  Problem Relation Age of Onset  . Hypertension Maternal Grandfather     Social History:  reports that he has been smoking Cigarettes.  He has been smoking about 0.50 packs per day. He does not have any smokeless tobacco history on file. He reports that he drinks alcohol. He reports that he does not use illicit drugs.  Allergies: No Known Allergies  Medications:  Scheduled Meds: . piperacillin-tazobactam (ZOSYN)  IV  3.375 g Intravenous 3 times per day   Continuous Infusions: . sodium chloride 125 mL/hr at 03/22/15 0900   PRN Meds:.acetaminophen **OR** [DISCONTINUED] acetaminophen, hydrALAZINE, HYDROmorphone (DILAUDID) injection, ondansetron **OR** ondansetron (ZOFRAN) IV, sodium chloride   Results for orders placed or performed during the hospital encounter of 03/21/15 (from the past 48 hour(s))  Lipase, blood     Status: Abnormal   Collection Time: 03/21/15  1:55 PM  Result Value Ref Range   Lipase 1861 (H) 11 -  51 U/L    Comment: RESULTS CONFIRMED BY MANUAL DILUTION  Comprehensive metabolic panel     Status: Abnormal   Collection Time: 03/21/15  1:55 PM  Result Value Ref Range   Sodium 138 135 - 145 mmol/L   Potassium 3.8 3.5 - 5.1 mmol/L   Chloride 101 101 - 111 mmol/L   CO2 28 22 - 32 mmol/L   Glucose, Bld 128 (H) 65 - 99 mg/dL   BUN 9 6 - 20 mg/dL   Creatinine, Ser 1.22 0.61 - 1.24 mg/dL   Calcium 9.6 8.9 - 10.3 mg/dL   Total Protein 7.9 6.5 - 8.1 g/dL   Albumin 4.0 3.5 - 5.0 g/dL   AST 123 (H) 15 - 41 U/L   ALT 214 (H) 17 - 63 U/L   Alkaline Phosphatase 288 (H) 38 - 126 U/L   Total Bilirubin 8.3 (H) 0.3 - 1.2 mg/dL   GFR calc non Af Amer >60 >60 mL/min   GFR calc Af Amer >60 >60 mL/min    Comment: (NOTE) The eGFR has been calculated using the CKD EPI equation. This calculation has not been validated in all clinical situations. eGFR's persistently <60 mL/min signify possible Chronic Kidney Disease.    Anion gap 9 5 - 15  CBC     Status: None   Collection Time: 03/21/15  1:55 PM  Result Value Ref Range   WBC 8.4 4.0 - 10.5 K/uL   RBC 5.51 4.22 - 5.81 MIL/uL   Hemoglobin 15.0 13.0 - 17.0 g/dL   HCT 43.7 39.0 -  52.0 %   MCV 79.3 78.0 - 100.0 fL   MCH 27.2 26.0 - 34.0 pg   MCHC 34.3 30.0 - 36.0 g/dL   RDW 14.5 11.5 - 15.5 %   Platelets 283 150 - 400 K/uL  POC occult blood, ED     Status: None   Collection Time: 03/21/15  5:03 PM  Result Value Ref Range   Fecal Occult Bld NEGATIVE NEGATIVE  Glucose, capillary     Status: Abnormal   Collection Time: 03/21/15 11:51 PM  Result Value Ref Range   Glucose-Capillary 103 (H) 65 - 99 mg/dL  CBC     Status: Abnormal   Collection Time: 03/22/15  9:20 AM  Result Value Ref Range   WBC 14.0 (H) 4.0 - 10.5 K/uL   RBC 5.35 4.22 - 5.81 MIL/uL   Hemoglobin 14.2 13.0 - 17.0 g/dL   HCT 41.7 39.0 - 52.0 %   MCV 77.9 (L) 78.0 - 100.0 fL   MCH 26.5 26.0 - 34.0 pg   MCHC 34.1 30.0 - 36.0 g/dL   RDW 15.1 11.5 - 15.5 %   Platelets 282 150 -  400 K/uL  Protime-INR     Status: None   Collection Time: 03/22/15  9:20 AM  Result Value Ref Range   Prothrombin Time 13.4 11.6 - 15.2 seconds   INR 1.00 0.00 - 1.49    Ct Abdomen Pelvis W Contrast  03/21/2015  CLINICAL DATA:  49 year old male with lower abdominal pain and diaphoresis. No nausea or vomiting. EXAM: CT ABDOMEN AND PELVIS WITH CONTRAST TECHNIQUE: Multidetector CT imaging of the abdomen and pelvis was performed using the standard protocol following bolus administration of intravenous contrast. CONTRAST:  159m OMNIPAQUE IOHEXOL 300 MG/ML  SOLN COMPARISON:  Ultrasound dated 03/21/2015 FINDINGS: Minimal linear bibasilar dependent subsegmental atelectatic changes. The lung bases are otherwise clear. Top-normal cardiac size. No intra-abdominal free air or free fluid. The liver is unremarkable. There is faint haziness within the gallbladder fundus which may correspond to the stone seen on the ultrasound. There is mild dilatation of the CBD measuring up to 12 mm in diameter with normal distal tapering at the head of the pancreas. There is haziness of the wall of the biliary tree with haziness of adjacent fat. This is likely related to extension of the inflammatory changes of the pancreas. Sign An inflammatory/infectious etiology involving the gallbladder or biliary tree is not excluded. Clinical correlation is recommended. There is mild peripancreatic haziness. This finding in combination with elevated lipase level is compatible with acute pancreatitis. No drainable fluid collection/abscess or pseudocyst identified. The spleen, and the left adrenal gland appear unremarkable. There is a 2.7 x 2.6 cm indeterminate heterogeneously enhancing right adrenal nodule. MRI is recommended for further characterization. There is a 1.3 cm nonobstructing left renal lower pole calculus. No hydronephrosis. The right kidney appears unremarkable. The visualized ureters and urinary bladder appear unremarkable. The  prostate and seminal vesicles are grossly unremarkable. There is sigmoid diverticulosis with muscular hypertrophy. No active inflammation. There is apparent diffuse thickening of the distal colon, likely related to underdistention. Colitis is less likely but not excluded. Clinical correlation is recommended. No evidence of bowel obstruction. Normal appendix. The abdominal aorta and IVC appear unremarkable. No portal venous gas identified. There is no adenopathy. There is a small fat containing umbilical hernia. The abdominal wall soft tissues appear unremarkable. The osseous structures are intact. IMPRESSION: Findings most compatible with acute pancreatitis.  No abscess. Mild haziness of the biliary trees  likely extension of inflammation of the pancreas. An inflammatory/infectious involving the biliary tree as the primary pathology is less likely but not excluded. Clinical correlation is recommended. Indeterminate right adrenal nodule. MRI is recommended further characterization. Nonobstructing left renal inferior pole calculus. Sigmoid diverticulosis. Electronically Signed   By: Anner Crete M.D.   On: 03/21/2015 19:45   US Abdomen Limited  03/21/2015  CLINICAL DATA:  Abdominal pain. Pancreatitis. Rule out cholecystitis. EXAM: US ABDOMEN LIMITED - RIGHT UPPER QUADRANT COMPARISON:  None. FINDINGS: Gallbladder: Bold contracted gallbladder filled with gallstones. Negative sonographic Murphy sign. Gallbladder wall not thickened 2.5 mm Common bile duct: Diameter: 7.7 mm.  Mild common bile duct dilatation. Liver: No focal liver lesion.  Mild ductal dilatation IMPRESSION: Cholelithiasis without evidence of gallbladder wall thickening or pain over the gallbladder Mild biliary dilatation. Correlate with liver function tests. Common bile duct stone not excluded. Electronically Signed   By: Franchot Gallo M.D.   On: 03/21/2015 18:39    Review of Systems  Constitutional: Positive for chills and malaise/fatigue.  Negative for fever, weight loss and diaphoresis.  HENT: Negative for hearing loss.   Eyes: Negative for blurred vision, double vision, photophobia, pain and discharge.  Respiratory: Negative for cough, hemoptysis, sputum production, shortness of breath and wheezing.   Cardiovascular: Negative for chest pain, palpitations, orthopnea, claudication, leg swelling and PND.  Gastrointestinal: Positive for abdominal pain. Negative for heartburn, nausea, vomiting, diarrhea, constipation, blood in stool and melena.  Genitourinary: Positive for hematuria. Negative for dysuria, urgency, frequency and flank pain.  Musculoskeletal: Negative for myalgias, back pain, joint pain, falls and neck pain.  Neurological: Positive for headaches. Negative for dizziness, tingling, tremors, sensory change, speech change, focal weakness, seizures and loss of consciousness.  Psychiatric/Behavioral: Negative for depression, suicidal ideas and substance abuse.   Blood pressure 166/89, pulse 66, temperature 99 F (37.2 C), temperature source Oral, resp. rate 16, height 5' 8" (1.727 m), weight 112.084 kg (247 lb 1.6 oz), SpO2 98 %. Physical Exam  Constitutional: He is oriented to person, place, and time. He appears well-developed and well-nourished. No distress.  Cardiovascular: Normal rate, regular rhythm, normal heart sounds and intact distal pulses.  Exam reveals no gallop and no friction rub.   No murmur heard. Respiratory: Effort normal and breath sounds normal. No respiratory distress. He has no wheezes. He has no rales. He exhibits no tenderness.  GI: Soft. Bowel sounds are normal. He exhibits no distension and no mass. There is no rebound.  TTP upper abdomen, most appreciated LUQ.   Musculoskeletal: He exhibits no edema or tenderness.  Neurological: He is alert and oriented to person, place, and time.  Skin: Skin is warm and dry. No rash noted. He is not diaphoretic. No erythema. No pallor.  Psychiatric: He has a  normal mood and affect. His behavior is normal. Judgment and thought content normal.    Assessment/Plan: Biliary pancreatitis: GI following, plans for MRCP.  Lipase down to 86, but remains significantly tender.  Plan for cholecystectomy with IOC after pancreatitis improves clinically.  Agree with NPO and IV hydration.  Doubt he has cholecystitis, antibiotics per primary team.   Oneida Arenas, EMINA ANP-BC 03/22/2015, 11:12 AM

## 2015-03-22 NOTE — Progress Notes (Signed)
Patient Demographics:    Tommy Payne, is a 49 y.o. male, DOB - [birth year], WGN:[id number]  Admit date - 03/21/2015   Admitting Physician Eduard Clos, MD  Outpatient Primary MD for the patient is No PCP Per Patient  LOS - 1   Chief Complaint  Patient presents with  . Abdominal Pain        Subjective:    Tommy Payne today has, No headache, No chest pain, +ve epigastric abdominal pain - No Nausea, No new weakness tingling or numbness, No Cough - SOB.     Assessment  & Plan :     1. Gallstone Acute pancreatitis with possible cholecystitis and possible CBD stone. Repeat CMP pending, does have leukocytosis which post likely is reactionary, for now appropriate for empiric IV antibiotics, continue bowel rest with IV fluids, GI and CCS consult it. May require MRCP/ERCP followed eventually by cholecystectomy. Continue supportive care with pain control and nausea control.  Denies any alcohol use will check lipid panel as well tomorrow morning.   2. History of smoking. Counseled to quit.   Code Status : Full  Family Communication  : Son bedisde  Disposition Plan  : Home 3-4 days  Consults  :  GI, CCS  Procedures  :   CT scan of the abdomen and pelvis along with right upper quadrant ultrasound. Showing pancreatitis, possible cholecystitis with possible CBD dilation. Also confirming gallstones.  DVT Prophylaxis  :  Lovenox   Lab Results  Component Value Date   PLT 282 03/22/2015    Inpatient Medications  Scheduled Meds: . piperacillin-tazobactam (ZOSYN)  IV  3.375 g Intravenous 3 times per day   Continuous Infusions: . sodium chloride 125 mL/hr at 03/22/15 0900   PRN Meds:.acetaminophen **OR** [DISCONTINUED] acetaminophen, hydrALAZINE, HYDROmorphone (DILAUDID) injection,  ondansetron **OR** ondansetron (ZOFRAN) IV, sodium chloride  Antibiotics  :     Anti-infectives    Start     Dose/Rate Route Frequency Ordered Stop   03/21/15 2215  piperacillin-tazobactam (ZOSYN) IVPB 3.375 g     3.375 g 12.5 mL/hr over 240 Minutes Intravenous 3 times per day 03/21/15 2206          Objective:   Filed Vitals:   03/21/15 2000 03/21/15 2038 03/22/15 0519 03/22/15 0648  BP: 166/95 161/87 163/83 166/89  Pulse: 61 61 72 66  Temp:  99.3 F (37.4 C) 99 F (37.2 C)   TempSrc:  Oral Oral   Resp:  20 16   Height:   (1.727 m)    Weight:  112.084 kg (247 lb 1.6 oz)    SpO2: 96% 100% 98%     Wt Readings from Last 3 Encounters:  03/21/15 112.084 kg (247 lb 1.6 oz)  06/20/14 112.492 kg (248 lb)  06/28/13 110.315 kg (243 lb 3.2 oz)     Intake/Output Summary (Last 24 hours) at 03/22/15 1057 Last data filed at 03/22/15 0500  Gross per 24 hour  Intake 895.83 ml  Output      0 ml  Net 895.83 ml     Physical Exam  Awake Alert, Oriented X 3, No new F.N deficits, Normal affect [place].AT,PERRAL Supple Neck,No JVD, No cervical lymphadenopathy appriciated.  Symmetrical Chest wall movement, Good air movement bilaterally,  CTAB RRR,No Gallops,Rubs or new Murmurs, No Parasternal Heave +ve B.Sounds, Abd Soft, +ve epigastric and RUQ tenderness, No organomegaly appriciated, No rebound - guarding or rigidity. No Cyanosis, Clubbing or edema, No new Rash or bruise       Data Review:   Micro Results No results found for this or any previous visit (from the past 240 hour(s)).  Radiology Reports Ct Abdomen Pelvis W Contrast  03/21/2015  CLINICAL DATA:  49 year old male with lower abdominal pain and diaphoresis. No nausea or vomiting. EXAM: CT ABDOMEN AND PELVIS WITH CONTRAST TECHNIQUE: Multidetector CT imaging of the abdomen and pelvis was performed using the standard protocol following bolus administration of intravenous contrast. CONTRAST:  OMNIPAQUE IOHEXOL 300  MG/ML  SOLN COMPARISON:  Ultrasound dated 03/21/2015 FINDINGS: Minimal linear bibasilar dependent subsegmental atelectatic changes. The lung bases are otherwise clear. Top-normal cardiac size. No intra-abdominal free air or free fluid. The liver is unremarkable. There is faint haziness within the gallbladder fundus which may correspond to the stone seen on the ultrasound. There is mild dilatation of the CBD measuring up to 12 mm in diameter with normal distal tapering at the head of the pancreas. There is haziness of the wall of the biliary tree with haziness of adjacent fat. This is likely related to extension of the inflammatory changes of the pancreas. Sign An inflammatory/infectious etiology involving the gallbladder or biliary tree is not excluded. Clinical correlation is recommended. There is mild peripancreatic haziness. This finding in combination with elevated lipase level is compatible with acute pancreatitis. No drainable fluid collection/abscess or pseudocyst identified. The spleen, and the left adrenal gland appear unremarkable. There is a 2.7 x 2.6 cm indeterminate heterogeneously enhancing right adrenal nodule. MRI is recommended for further characterization. There is a 1.3 cm nonobstructing left renal lower pole calculus. No hydronephrosis. The right kidney appears unremarkable. The visualized ureters and urinary bladder appear unremarkable. The prostate and seminal vesicles are grossly unremarkable. There is sigmoid diverticulosis with muscular hypertrophy. No active inflammation. There is apparent diffuse thickening of the distal colon, likely related to underdistention. Colitis is less likely but not excluded. Clinical correlation is recommended. No evidence of bowel obstruction. Normal appendix. The abdominal aorta and IVC appear unremarkable. No portal venous gas identified. There is no adenopathy. There is a small fat containing umbilical hernia. The abdominal wall soft tissues appear  unremarkable. The osseous structures are intact. IMPRESSION: Findings most compatible with acute pancreatitis.  No abscess. Mild haziness of the biliary trees likely extension of inflammation of the pancreas. An inflammatory/infectious involving the biliary tree as the primary pathology is less likely but not excluded. Clinical correlation is recommended. Indeterminate right adrenal nodule. MRI is recommended further characterization. Nonobstructing left renal inferior pole calculus. Sigmoid diverticulosis. Electronically Signed   By: Elgie Collard M.D.   On: 03/21/2015 19:45   US Abdomen Limited  03/21/2015  CLINICAL DATA:  Abdominal pain. Pancreatitis. Rule out cholecystitis. EXAM: US ABDOMEN LIMITED - RIGHT UPPER QUADRANT COMPARISON:  None. FINDINGS: Gallbladder: Bold contracted gallbladder filled with gallstones. Negative sonographic Murphy sign. Gallbladder wall not thickened 2.5 mm Common bile duct: Diameter: 7.7 mm.  Mild common bile duct dilatation. Liver: No focal liver lesion.  Mild ductal dilatation IMPRESSION: Cholelithiasis without evidence of gallbladder wall thickening or pain over the gallbladder Mild biliary dilatation. Correlate with liver function tests. Common bile duct stone not excluded. Electronically Signed   By: Marlan Palau M.D.   On: 03/21/2015 18:39     CBC  Recent Labs Lab 03/21/15 1355 03/22/15 0920  WBC 8.4 14.0*  HGB 15.0 14.2  HCT 43.7 41.7  PLT 283 282  MCV 79.3 77.9*  MCH 27.2 26.5  MCHC 34.3 34.1  RDW 14.5 15.1    Chemistries   Recent Labs Lab 03/21/15 1355  NA 138  K 3.8  CL 101  CO2 28  GLUCOSE 128*  BUN 9  CREATININE 1.22  CALCIUM 9.6  AST 123*  ALT 214*  ALKPHOS 288*  BILITOT 8.3*   ------------------------------------------------------------------------------------------------------------------ estimated creatinine clearance is 89 mL/min (by C-G formula based on Cr of  1.22). ------------------------------------------------------------------------------------------------------------------ No results for input(s): HGBA1C in the last 72 hours. ------------------------------------------------------------------------------------------------------------------ No results for input(s): CHOL, HDL, LDLCALC, TRIG, CHOLHDL, LDLDIRECT in the last 72 hours. ------------------------------------------------------------------------------------------------------------------ No results for input(s): TSH, T4TOTAL, T3FREE, THYROIDAB in the last 72 hours.  Invalid input(s): FREET3 ------------------------------------------------------------------------------------------------------------------ No results for input(s): VITAMINB12, FOLATE, FERRITIN, TIBC, IRON, RETICCTPCT in the last 72 hours.  Coagulation profile  Recent Labs Lab 03/22/15 0920  INR 1.00    No results for input(s): DDIMER in the last 72 hours.  Cardiac Enzymes No results for input(s): CKMB, TROPONINI, MYOGLOBIN in the last 168 hours.  Invalid input(s): CK ------------------------------------------------------------------------------------------------------------------ Invalid input(s): POCBNP   Time Spent in minutes   35   Lavonne Cass K M.D on 03/22/2015 at 10:57 AM  Between 7am to 7pm - Pager - [phone]  After 7pm go to [email or web address] - password [email or web address]  [facility] -  Office  [phone]

## 2015-03-23 DIAGNOSIS — R030 Elevated blood-pressure reading, without diagnosis of hypertension: Secondary | ICD-10-CM

## 2015-03-23 DIAGNOSIS — K8050 Calculus of bile duct without cholangitis or cholecystitis without obstruction: Secondary | ICD-10-CM

## 2015-03-23 LAB — COMPREHENSIVE METABOLIC PANEL
ALK PHOS: 258 U/L — AB (ref 38–126)
ALT: 117 U/L — AB (ref 17–63)
AST: 39 U/L (ref 15–41)
Albumin: 3.2 g/dL — ABNORMAL LOW (ref 3.5–5.0)
Anion gap: 9 (ref 5–15)
BILIRUBIN TOTAL: 2.9 mg/dL — AB (ref 0.3–1.2)
BUN: 10 mg/dL (ref 6–20)
CALCIUM: 9.1 mg/dL (ref 8.9–10.3)
CHLORIDE: 101 mmol/L (ref 101–111)
CO2: 26 mmol/L (ref 22–32)
CREATININE: 1.16 mg/dL (ref 0.61–1.24)
Glucose, Bld: 118 mg/dL — ABNORMAL HIGH (ref 65–99)
Potassium: 3.9 mmol/L (ref 3.5–5.1)
SODIUM: 136 mmol/L (ref 135–145)
TOTAL PROTEIN: 7.5 g/dL (ref 6.5–8.1)

## 2015-03-23 LAB — URINALYSIS, ROUTINE W REFLEX MICROSCOPIC
GLUCOSE, UA: NEGATIVE mg/dL
KETONES UR: 15 mg/dL — AB
Nitrite: NEGATIVE
PH: 5.5 (ref 5.0–8.0)
Protein, ur: NEGATIVE mg/dL
Specific Gravity, Urine: 1.028 (ref 1.005–1.030)

## 2015-03-23 LAB — LIPID PANEL
CHOL/HDL RATIO: 2.2 ratio
CHOLESTEROL: 209 mg/dL — AB (ref 0–200)
HDL: 95 mg/dL (ref >40)
LDL Cholesterol: 102 mg/dL — ABNORMAL HIGH (ref 0–99)
TRIGLYCERIDES: 59 mg/dL (ref <150)
VLDL: 12 mg/dL (ref 0–40)

## 2015-03-23 LAB — CBC
HCT: 40.9 % (ref 39.0–52.0)
Hemoglobin: 13.8 g/dL (ref 13.0–17.0)
MCH: 26.4 pg (ref 26.0–34.0)
MCHC: 33.7 g/dL (ref 30.0–36.0)
MCV: 78.4 fL (ref 78.0–100.0)
PLATELETS: 286 10*3/uL (ref 150–400)
RBC: 5.22 MIL/uL (ref 4.22–5.81)
RDW: 14.9 % (ref 11.5–15.5)
WBC: 23.9 10*3/uL — AB (ref 4.0–10.5)

## 2015-03-23 LAB — URINE MICROSCOPIC-ADD ON

## 2015-03-23 LAB — GLUCOSE, CAPILLARY
GLUCOSE-CAPILLARY: 145 mg/dL — AB (ref 65–99)
GLUCOSE-CAPILLARY: 120 mg/dL — AB (ref 65–99)
Glucose-Capillary: 123 mg/dL — ABNORMAL HIGH (ref 65–99)
Glucose-Capillary: 117 mg/dL — ABNORMAL HIGH (ref 65–99)

## 2015-03-23 MED ORDER — BISACODYL 5 MG PO TBEC
10.0000 mg | DELAYED_RELEASE_TABLET | Freq: Every day | ORAL | Status: DC
  Administered 2015-03-23 – 2015-03-27 (×5): 10 mg via ORAL
  Filled 2015-03-23 (×5): qty 2

## 2015-03-23 MED ORDER — SODIUM CHLORIDE 0.9 % IV SOLN
INTRAVENOUS | Status: DC
  Administered 2015-03-23 – 2015-03-25 (×5): via INTRAVENOUS

## 2015-03-23 NOTE — Progress Notes (Signed)
Patient Demographics:    Tommy Payne, is a 49 y.o. male, DOB - Apr 01, 1965, ZOX:[id number]  Admit date - 03/21/2015   Admitting Physician Eduard ClosArshad N Kakrakandy, MD  Outpatient Primary MD for the patient is No PCP Per Patient  LOS - 2   Chief Complaint  Patient presents with  . Abdominal Pain        Subjective:    Tommy PolkaJimmy Payne today has, No headache, No chest pain, +ve epigastric abdominal pain - No Nausea, No new weakness tingling or numbness, No Cough - SOB.     Assessment  & Plan :     1. Gallstone Acute pancreatitis with possible cholecystitis and possible CBD stone. Repeat CMP pending, does have leukocytosis which post likely is reactionary, MRCP and CT abdomen rule out any necrosis or pseudocyst will stop antibiotics, continue bowel rest with IV fluids, GI and CCS consult it. MRCP appears stable, per GI no ERCP, CCS to decide on the timing of cholecystectomy. Patient denies any alcohol use lipid panel stable    2. History of smoking. Counseled to quit.   Code Status : Full  Family Communication  : Son bedisde  Disposition Plan  : Home 3-4 days  Consults  :  GI, CCS  Procedures  :   CT scan of the abdomen and pelvis along with right upper quadrant ultrasound. Showing pancreatitis, possible cholecystitis with possible CBD dilation. Also confirming cholelithiasis.  MRCP. Confirms pancreatitis but no CBD stone, rules out active cholecystitis  DVT Prophylaxis  :  Lovenox   Lab Results  Component Value Date   PLT 286 03/23/2015    Inpatient Medications  Scheduled Meds: . bisacodyl  10 mg Oral Daily  . enoxaparin (LOVENOX) injection  0.5 mg/kg Subcutaneous Q24H  . piperacillin-tazobactam (ZOSYN)  IV  3.375 g Intravenous 3 times per day   Continuous Infusions: . sodium chloride  75 mL/hr at 03/23/15 0830   PRN Meds:.acetaminophen **OR** [DISCONTINUED] acetaminophen, hydrALAZINE, HYDROmorphone (DILAUDID) injection, ondansetron **OR** ondansetron (ZOFRAN) IV, sodium chloride  Antibiotics  :     Anti-infectives    Start     Dose/Rate Route Frequency Ordered Stop   03/21/15 2215  piperacillin-tazobactam (ZOSYN) IVPB 3.375 g     3.375 g 12.5 mL/hr over 240 Minutes Intravenous 3 times per day 03/21/15 2206          Objective:   Filed Vitals:   03/22/15 0519 03/22/15 0648 03/22/15 2209 03/23/15 0514  BP: 163/83 166/89 172/90 149/88  Pulse: 72 66 84 87  Temp: 99 F (37.2 C)  98.8 F (37.1 C) 98.6 F (37 C)  TempSrc: Oral  Oral Oral  Resp: 16  18 16   Height:      Weight:      SpO2: 98%  95% 97%    Wt Readings from Last 3 Encounters:  03/21/15 112.084 kg (247 lb 1.6 oz)  06/20/14 112.492 kg (248 lb)  06/28/13 110.315 kg (243 lb 3.2 oz)     Intake/Output Summary (Last 24 hours) at 03/23/15 1055 Last data filed at 03/23/15 0646  Gross per 24 hour  Intake    150 ml  Output    575 ml  Net   -425 ml     Physical Exam  Awake Alert, Oriented X 3, No new F.N deficits, Normal affect [place].AT,PERRAL Supple Neck,No JVD, No cervical lymphadenopathy appriciated.  Symmetrical Chest wall movement, Good air movement bilaterally, CTAB RRR,No Gallops,Rubs or new Murmurs, No Parasternal Heave +ve B.Sounds, Abd Soft, +ve epigastric and RUQ tenderness, No organomegaly appriciated, No rebound - guarding or rigidity. No Cyanosis, Clubbing or edema, No new Rash or bruise       Data Review:   Micro Results No results found for this or any previous visit (from the past 240 hour(s)).  Radiology Reports Ct Abdomen Pelvis W Contrast  03/21/2015  CLINICAL DATA:  49 year old male with lower abdominal pain and diaphoresis. No nausea or vomiting. EXAM: CT ABDOMEN AND PELVIS WITH CONTRAST TECHNIQUE: Multidetector CT imaging of the abdomen and pelvis was performed using  the standard protocol following bolus administration of intravenous contrast. CONTRAST:  OMNIPAQUE IOHEXOL 300 MG/ML  SOLN COMPARISON:  Ultrasound dated 03/21/2015 FINDINGS: Minimal linear bibasilar dependent subsegmental atelectatic changes. The lung bases are otherwise clear. Top-normal cardiac size. No intra-abdominal free air or free fluid. The liver is unremarkable. There is faint haziness within the gallbladder fundus which may correspond to the stone seen on the ultrasound. There is mild dilatation of the CBD measuring up to 12 mm in diameter with normal distal tapering at the head of the pancreas. There is haziness of the wall of the biliary tree with haziness of adjacent fat. This is likely related to extension of the inflammatory changes of the pancreas. Sign An inflammatory/infectious etiology involving the gallbladder or biliary tree is not excluded. Clinical correlation is recommended. There is mild peripancreatic haziness. This finding in combination with elevated lipase level is compatible with acute pancreatitis. No drainable fluid collection/abscess or pseudocyst identified. The spleen, and the left adrenal gland appear unremarkable. There is a 2.7 x 2.6 cm indeterminate heterogeneously enhancing right adrenal nodule. MRI is recommended for further characterization. There is a 1.3 cm nonobstructing left renal lower pole calculus. No hydronephrosis. The right kidney appears unremarkable. The visualized ureters and urinary bladder appear unremarkable. The prostate and seminal vesicles are grossly unremarkable. There is sigmoid diverticulosis with muscular hypertrophy. No active inflammation. There is apparent diffuse thickening of the distal colon, likely related to underdistention. Colitis is less likely but not excluded. Clinical correlation is recommended. No evidence of bowel obstruction. Normal appendix. The abdominal aorta and IVC appear unremarkable. No portal venous gas identified. There  is no adenopathy. There is a small fat containing umbilical hernia. The abdominal wall soft tissues appear unremarkable. The osseous structures are intact. IMPRESSION: Findings most compatible with acute pancreatitis.  No abscess. Mild haziness of the biliary trees likely extension of inflammation of the pancreas. An inflammatory/infectious involving the biliary tree as the primary pathology is less likely but not excluded. Clinical correlation is recommended. Indeterminate right adrenal nodule. MRI is recommended further characterization. Nonobstructing left renal inferior pole calculus. Sigmoid diverticulosis. Electronically Signed   By: Elgie Collard M.D.   On: 03/21/2015 19:45   Mr 3d Recon At Scanner  03/23/2015  CLINICAL DATA:  Acute pancreatitis EXAM: MRI ABDOMEN WITHOUT AND WITH CONTRAST (INCLUDING MRCP) TECHNIQUE: Multiplanar multisequence MR imaging of the abdomen was performed both before and after the administration of intravenous contrast. Heavily T2-weighted images of the biliary and pancreatic ducts were obtained, and three-dimensional MRCP images were rendered by post processing. CONTRAST:  20mL MULTIHANCE GADOBENATE DIMEGLUMINE 529 MG/ML IV SOLN COMPARISON:  CT scan 03/21/2015 and ultrasound abdomen 03/21/2015. FINDINGS: Examination is limited  by breathing motion artifact. Lower chest: Minimal dependent bibasilar atelectasis. No pleural effusion. The heart is upper limits of normal in size. No pericardial effusion. The distal esophagus is grossly normal. Hepatobiliary: No focal hepatic lesions or intrahepatic biliary dilatation. The common bile duct is mildly dilated in the porta hepatis at 7 mm. It tapers normally in the pancreatic head. No common bile duct stones are identified. The gallbladder is filled with small gallstones. No findings for acute cholecystitis. Pancreas: No mass or ductal dilatation. Mild diffuse inflammation. Normal pancreatic enhancement. No peripancreatic fluid  collections. Spleen: Normal size.  No focal lesions. Adrenals/Urinary Tract: Right adrenal gland lesion as noted on the CT scan demonstrates signal loss on the out of phase T1 weighted gradient echo sequence consistent with a benign adenoma. The left adrenal gland is normal. Both kidneys are grossly normal. Stomach/Bowel: The stomach, duodenum, visualized small bowel and visualized colon are grossly normal. No inflammatory changes, mass lesions or obstructive findings. Vascular/Lymphatic: Small scattered mesenteric and retroperitoneal lymph nodes but no mass or adenopathy. The aorta and branch vessels are normal. Other: No ascites or worrisome fluid collections. Musculoskeletal: No significant bony findings. IMPRESSION: 1. Examination is limited by motion artifact. 2. Cholelithiasis without findings for acute cholecystitis. 3. Borderline common bile duct dilatation in the porta hepatis but normal in the head of the pancreas. No definite obstructing common bile duct stones are identified. 4. Mild inflammation of the pancreatic head. No peripancreatic fluid collections or pancreatic necrosis. Electronically Signed   By: Rudie Meyer M.D.   On: 03/23/2015 08:24   US Abdomen Limited  03/21/2015  CLINICAL DATA:  Abdominal pain. Pancreatitis. Rule out cholecystitis. EXAM: US ABDOMEN LIMITED - RIGHT UPPER QUADRANT COMPARISON:  None. FINDINGS: Gallbladder: Bold contracted gallbladder filled with gallstones. Negative sonographic Murphy sign. Gallbladder wall not thickened 2.5 mm Common bile duct: Diameter: 7.7 mm.  Mild common bile duct dilatation. Liver: No focal liver lesion.  Mild ductal dilatation IMPRESSION: Cholelithiasis without evidence of gallbladder wall thickening or pain over the gallbladder Mild biliary dilatation. Correlate with liver function tests. Common bile duct stone not excluded. Electronically Signed   By: Marlan Palau M.D.   On: 03/21/2015 18:39   Mr Abd W/wo Cm/mrcp  03/23/2015  CLINICAL  DATA:  Acute pancreatitis EXAM: MRI ABDOMEN WITHOUT AND WITH CONTRAST (INCLUDING MRCP) TECHNIQUE: Multiplanar multisequence MR imaging of the abdomen was performed both before and after the administration of intravenous contrast. Heavily T2-weighted images of the biliary and pancreatic ducts were obtained, and three-dimensional MRCP images were rendered by post processing. CONTRAST:  20mL MULTIHANCE GADOBENATE DIMEGLUMINE 529 MG/ML IV SOLN COMPARISON:  CT scan 03/21/2015 and ultrasound abdomen 03/21/2015. FINDINGS: Examination is limited by breathing motion artifact. Lower chest: Minimal dependent bibasilar atelectasis. No pleural effusion. The heart is upper limits of normal in size. No pericardial effusion. The distal esophagus is grossly normal. Hepatobiliary: No focal hepatic lesions or intrahepatic biliary dilatation. The common bile duct is mildly dilated in the porta hepatis at 7 mm. It tapers normally in the pancreatic head. No common bile duct stones are identified. The gallbladder is filled with small gallstones. No findings for acute cholecystitis. Pancreas: No mass or ductal dilatation. Mild diffuse inflammation. Normal pancreatic enhancement. No peripancreatic fluid collections. Spleen: Normal size.  No focal lesions. Adrenals/Urinary Tract: Right adrenal gland lesion as noted on the CT scan demonstrates signal loss on the out of phase T1 weighted gradient echo sequence consistent with a benign adenoma. The left adrenal gland is  normal. Both kidneys are grossly normal. Stomach/Bowel: The stomach, duodenum, visualized small bowel and visualized colon are grossly normal. No inflammatory changes, mass lesions or obstructive findings. Vascular/Lymphatic: Small scattered mesenteric and retroperitoneal lymph nodes but no mass or adenopathy. The aorta and branch vessels are normal. Other: No ascites or worrisome fluid collections. Musculoskeletal: No significant bony findings. IMPRESSION: 1. Examination is  limited by motion artifact. 2. Cholelithiasis without findings for acute cholecystitis. 3. Borderline common bile duct dilatation in the porta hepatis but normal in the head of the pancreas. No definite obstructing common bile duct stones are identified. 4. Mild inflammation of the pancreatic head. No peripancreatic fluid collections or pancreatic necrosis. Electronically Signed   By: Rudie Meyer M.D.   On: 03/23/2015 08:24     CBC  Recent Labs Lab 03/21/15 1355 03/22/15 0920 03/23/15 0709  WBC 8.4 14.0* 23.9*  HGB 15.0 14.2 13.8  HCT 43.7 41.7 40.9  PLT 283 282 286  MCV 79.3 77.9* 78.4  MCH 27.2 26.5 26.4  MCHC 34.3 34.1 33.7  RDW 14.5 15.1 14.9    Chemistries   Recent Labs Lab 03/21/15 1355 03/22/15 0920 03/23/15 0709  NA 138 138 136  K 3.8 4.7 3.9  CL 101 105 101  CO2 28 21* 26  GLUCOSE 128* 91 118*  BUN 9 8 10   CREATININE 1.22 1.17 1.16  CALCIUM 9.6 9.0 9.1  AST 123* 85* 39  ALT 214* 165* 117*  ALKPHOS 288* 290* 258*  BILITOT 8.3* 6.2* 2.9*   ------------------------------------------------------------------------------------------------------------------ estimated creatinine clearance is 93.6 mL/min (by C-G formula based on Cr of 1.16). ------------------------------------------------------------------------------------------------------------------ No results for input(s): HGBA1C in the last 72 hours. ------------------------------------------------------------------------------------------------------------------  Recent Labs  03/22/15 0920 03/23/15 0709  CHOL 211* 209*  HDL 91 95  LDLCALC 105* 102*  TRIG 73 59  CHOLHDL 2.3 2.2   ------------------------------------------------------------------------------------------------------------------ No results for input(s): TSH, T4TOTAL, T3FREE, THYROIDAB in the last 72 hours.  Invalid input(s):  FREET3 ------------------------------------------------------------------------------------------------------------------ No results for input(s): VITAMINB12, FOLATE, FERRITIN, TIBC, IRON, RETICCTPCT in the last 72 hours.  Coagulation profile  Recent Labs Lab 03/22/15 0920  INR 1.00    No results for input(s): DDIMER in the last 72 hours.  Cardiac Enzymes No results for input(s): CKMB, TROPONINI, MYOGLOBIN in the last 168 hours.  Invalid input(s): CK ------------------------------------------------------------------------------------------------------------------ Invalid input(s): POCBNP   Time Spent in minutes   35   SINGH,PRASHANT K M.D on 03/23/2015 at 10:55 AM  Between 7am to 7pm - Pager - [phone]  After 7pm go to [email or web address] - password [email or web address]  [facility] -  Office  [phone]

## 2015-03-23 NOTE — Progress Notes (Signed)
Subjective: Abdominal pain improving.  Objective: Vital signs in last 24 hours: Temp:  [98.6 F (37 C)-98.8 F (37.1 C)] 98.6 F (37 C) (12/27 0514) Pulse Rate:  [84-87] 87 (12/27 0514) Resp:  [16-18] 16 (12/27 0514) BP: (149-172)/(88-90) 149/88 mmHg (12/27 0514) SpO2:  [95 %-97 %] 97 % (12/27 0514) Weight change:  Last BM Date: 03/21/15  PE: GEN:  Overweight, NAD HEENT:  Slight scleral icterus ABD:  Protuberant, soft, ballotable fullness in epigastrium, active bowel sounds, very mild epigastric tenderness  Lab Results: CBC    Component Value Date/Time   WBC 23.9* 03/23/2015 0709   RBC 5.22 03/23/2015 0709   HGB 13.8 03/23/2015 0709   HCT 40.9 03/23/2015 0709   PLT 286 03/23/2015 0709   MCV 78.4 03/23/2015 0709   MCH 26.4 03/23/2015 0709   MCHC 33.7 03/23/2015 0709   RDW 14.9 03/23/2015 0709   CMP     Component Value Date/Time   NA 136 03/23/2015 0709   K 3.9 03/23/2015 0709   CL 101 03/23/2015 0709   CO2 26 03/23/2015 0709   GLUCOSE 118* 03/23/2015 0709   BUN 10 03/23/2015 0709   CREATININE 1.16 03/23/2015 0709   CALCIUM 9.1 03/23/2015 0709   PROT 7.5 03/23/2015 0709   ALBUMIN 3.2* 03/23/2015 0709   AST 39 03/23/2015 0709   ALT 117* 03/23/2015 0709   ALKPHOS 258* 03/23/2015 0709   BILITOT 2.9* 03/23/2015 0709   GFRNONAA >60 03/23/2015 0709   GFRAA >60 03/23/2015 0709   Lipase     Component Value Date/Time   LIPASE 86* 03/22/2015 0920   Studies/Results: MRCP (personally reviewed):  Mild pancreatitis.  Borderline CBD dilatation 7mm; no choledocholithiasis seen.  Assessment:  1.  Gallstone pancreatitis, improving. 2.  Elevated LFTs, downtrending, no CBD stones seen on MRCP. 3.  Gallstones.  Plan:  1.  Sips of clear liquids OK. 2.  Based on clinical improvement, downtrend on LFTs and lack of choledocholithiasis, patient does not need ERCP at the present time. 3.  Principal management issue remaining is timing of cholecystectomy, which [facility]  [facility]. 4.  Will follow.   Freddy JakschOUTLAW,Laranda Burkemper M 03/23/2015, 9:38 AM   Pager [phone] If no answer or after 5 PM call [phone]

## 2015-03-23 NOTE — Progress Notes (Signed)
Patient ID: Tommy Payne, male   DOB: 1965/07/23, 49 y.o.   MRN: [id number]     [facility]      [address] [place], [place] [address]    Phone: [phone] FAX: [phone]     Subjective: Pain is better.  Thirsty. No n/v.  No flatus. WBC up to 23.9k.  Afebrile.  Objective:  Vital signs:  Filed Vitals:   03/22/15 0519 03/22/15 0648 03/22/15 2209 03/23/15 0514  BP: 163/83 166/89 172/90 149/88  Pulse: 72 66 84 87  Temp: 99 F (37.2 C)  98.8 F (37.1 C) 98.6 F (37 C)  TempSrc: Oral  Oral Oral  Resp: _0 Height:      Weight:      SpO2: 98%  95% 97%    Last BM Date: 03/21/15  Intake/Output   Yesterday:  12/26 0701 - 12/27 0700 In: 150 [IV Piggyback:150] Out: 575 [Urine:575] This shift:    I/O last 3 completed shifts: In: 1045.8 [I.V.:795.8; IV Piggyback:250] Out: 012 [Urine:575]     Physical Exam: General: Pt awake/alert/oriented x4 in no acute distress  Abdomen:  +bs, abdomen is soft, TTP RUQ, minimal LUQ.    Problem List:   Principal Problem:   Pancreatitis, acute Active Problems:   Choledocholithiasis   Elevated blood pressure    Results:   Labs: Results for orders placed or performed during the hospital encounter of 03/21/15 (from the past 48 hour(s))  Lipase, blood     Status: Abnormal   Collection Time: 03/21/15  1:55 PM  Result Value Ref Range   Lipase 1861 (H) 11 - 51 U/L    Comment: RESULTS CONFIRMED BY MANUAL DILUTION  Comprehensive metabolic panel     Status: Abnormal   Collection Time: 03/21/15  1:55 PM  Result Value Ref Range   Sodium 138 135 - 145 mmol/L   Potassium 3.8 3.5 - 5.1 mmol/L   Chloride 101 101 - 111 mmol/L   CO2 28 22 - 32 mmol/L   Glucose, Bld 128 (H) 65 - 99 mg/dL   BUN 9 6 - 20 mg/dL   Creatinine, Ser 1.22 0.61 - 1.24 mg/dL   Calcium 9.6 8.9 - 10.3 mg/dL   Total Protein 7.9 6.5 - 8.1 g/dL   Albumin 4.0 3.5 - 5.0 g/dL   AST 123 (H) 15 - 41 U/L   ALT  214 (H) 17 - 63 U/L   Alkaline Phosphatase 288 (H) 38 - 126 U/L   Total Bilirubin 8.3 (H) 0.3 - 1.2 mg/dL   GFR calc non Af Amer >60 >60 mL/min   GFR calc Af Amer >60 >60 mL/min    Comment: (NOTE) The eGFR has been calculated using the CKD EPI equation. This calculation has not been validated in all clinical situations. eGFR's persistently <60 mL/min signify possible Chronic Kidney Disease.    Anion gap 9 5 - 15  CBC     Status: None   Collection Time: 03/21/15  1:55 PM  Result Value Ref Range   WBC 8.4 4.0 - 10.5 K/uL   RBC 5.51 4.22 - 5.81 MIL/uL   Hemoglobin 15.0 13.0 - 17.0 g/dL   HCT 43.7 39.0 - 52.0 %   MCV 79.3 78.0 - 100.0 fL   MCH 27.2 26.0 - 34.0 pg   MCHC 34.3 30.0 - 36.0 g/dL   RDW 14.5 11.5 - 15.5 %   Platelets 283 150 - 400 K/uL  POC  occult blood, ED     Status: None   Collection Time: 03/21/15  5:03 PM  Result Value Ref Range   Fecal Occult Bld NEGATIVE NEGATIVE  Glucose, capillary     Status: Abnormal   Collection Time: 03/21/15 11:51 PM  Result Value Ref Range   Glucose-Capillary 103 (H) 65 - 99 mg/dL  Lipase, blood     Status: Abnormal   Collection Time: 03/22/15  9:20 AM  Result Value Ref Range   Lipase 86 (H) 11 - 51 U/L  CBC     Status: Abnormal   Collection Time: 03/22/15  9:20 AM  Result Value Ref Range   WBC 14.0 (H) 4.0 - 10.5 K/uL   RBC 5.35 4.22 - 5.81 MIL/uL   Hemoglobin 14.2 13.0 - 17.0 g/dL   HCT 41.7 39.0 - 52.0 %   MCV 77.9 (L) 78.0 - 100.0 fL   MCH 26.5 26.0 - 34.0 pg   MCHC 34.1 30.0 - 36.0 g/dL   RDW 15.1 11.5 - 15.5 %   Platelets 282 150 - 400 K/uL  Lipid panel     Status: Abnormal   Collection Time: 03/22/15  9:20 AM  Result Value Ref Range   Cholesterol 211 (H) 0 - 200 mg/dL   Triglycerides 73 <150 mg/dL   HDL 91 >40 mg/dL   Total CHOL/HDL Ratio 2.3 RATIO   VLDL 15 0 - 40 mg/dL   LDL Cholesterol 105 (H) 0 - 99 mg/dL    Comment:        Total Cholesterol/HDL:CHD Risk Coronary Heart Disease Risk Table                      Men   Women  1/2 Average Risk   3.4   3.3  Average Risk       5.0   4.4  2 X Average Risk   9.6   7.1  3 X Average Risk  23.4   11.0        Use the calculated Patient Ratio above and the CHD Risk Table to determine the patient's CHD Risk.        ATP III CLASSIFICATION (LDL):  <100     mg/dL   Optimal  100-129  mg/dL   Near or Above                    Optimal  130-159  mg/dL   Borderline  160-189  mg/dL   High  >190     mg/dL   Very High   Protime-INR     Status: None   Collection Time: 03/22/15  9:20 AM  Result Value Ref Range   Prothrombin Time 13.4 11.6 - 15.2 seconds   INR 1.00 0.00 - 1.49  Comprehensive metabolic panel     Status: Abnormal   Collection Time: 03/22/15  9:20 AM  Result Value Ref Range   Sodium 138 135 - 145 mmol/L   Potassium 4.7 3.5 - 5.1 mmol/L   Chloride 105 101 - 111 mmol/L   CO2 21 (L) 22 - 32 mmol/L   Glucose, Bld 91 65 - 99 mg/dL   BUN 8 6 - 20 mg/dL   Creatinine, Ser 1.17 0.61 - 1.24 mg/dL   Calcium 9.0 8.9 - 10.3 mg/dL   Total Protein 7.2 6.5 - 8.1 g/dL   Albumin 3.5 3.5 - 5.0 g/dL   AST 85 (H) 15 - 41 U/L   ALT 165 (H) 17 -  63 U/L   Alkaline Phosphatase 290 (H) 38 - 126 U/L   Total Bilirubin 6.2 (H) 0.3 - 1.2 mg/dL   GFR calc non Af Amer >60 >60 mL/min   GFR calc Af Amer >60 >60 mL/min    Comment: (NOTE) The eGFR has been calculated using the CKD EPI equation. This calculation has not been validated in all clinical situations. eGFR's persistently <60 mL/min signify possible Chronic Kidney Disease.    Anion gap 12 5 - 15  Glucose, capillary     Status: Abnormal   Collection Time: 03/22/15 12:09 PM  Result Value Ref Range   Glucose-Capillary 103 (H) 65 - 99 mg/dL  Glucose, capillary     Status: None   Collection Time: 03/22/15  6:54 PM  Result Value Ref Range   Glucose-Capillary 99 65 - 99 mg/dL  Glucose, capillary     Status: Abnormal   Collection Time: 03/23/15 12:06 AM  Result Value Ref Range   Glucose-Capillary 123 (H) 65 - 99  mg/dL  Glucose, capillary     Status: Abnormal   Collection Time: 03/23/15  6:21 AM  Result Value Ref Range   Glucose-Capillary 117 (H) 65 - 99 mg/dL  CBC     Status: Abnormal   Collection Time: 03/23/15  7:09 AM  Result Value Ref Range   WBC 23.9 (H) 4.0 - 10.5 K/uL   RBC 5.22 4.22 - 5.81 MIL/uL   Hemoglobin 13.8 13.0 - 17.0 g/dL   HCT 40.9 39.0 - 52.0 %   MCV 78.4 78.0 - 100.0 fL   MCH 26.4 26.0 - 34.0 pg   MCHC 33.7 30.0 - 36.0 g/dL   RDW 14.9 11.5 - 15.5 %   Platelets 286 150 - 400 K/uL    Imaging / Studies: Ct Abdomen Pelvis W Contrast  03/21/2015  CLINICAL DATA:  49 year old male with lower abdominal pain and diaphoresis. No nausea or vomiting. EXAM: CT ABDOMEN AND PELVIS WITH CONTRAST TECHNIQUE: Multidetector CT imaging of the abdomen and pelvis was performed using the standard protocol following bolus administration of intravenous contrast. CONTRAST:  174m OMNIPAQUE IOHEXOL 300 MG/ML  SOLN COMPARISON:  Ultrasound dated 03/21/2015 FINDINGS: Minimal linear bibasilar dependent subsegmental atelectatic changes. The lung bases are otherwise clear. Top-normal cardiac size. No intra-abdominal free air or free fluid. The liver is unremarkable. There is faint haziness within the gallbladder fundus which may correspond to the stone seen on the ultrasound. There is mild dilatation of the CBD measuring up to 12 mm in diameter with normal distal tapering at the head of the pancreas. There is haziness of the wall of the biliary tree with haziness of adjacent fat. This is likely related to extension of the inflammatory changes of the pancreas. Sign An inflammatory/infectious etiology involving the gallbladder or biliary tree is not excluded. Clinical correlation is recommended. There is mild peripancreatic haziness. This finding in combination with elevated lipase level is compatible with acute pancreatitis. No drainable fluid collection/abscess or pseudocyst identified. The spleen, and the left  adrenal gland appear unremarkable. There is a 2.7 x 2.6 cm indeterminate heterogeneously enhancing right adrenal nodule. MRI is recommended for further characterization. There is a 1.3 cm nonobstructing left renal lower pole calculus. No hydronephrosis. The right kidney appears unremarkable. The visualized ureters and urinary bladder appear unremarkable. The prostate and seminal vesicles are grossly unremarkable. There is sigmoid diverticulosis with muscular hypertrophy. No active inflammation. There is apparent diffuse thickening of the distal colon, likely related to underdistention. Colitis is less  likely but not excluded. Clinical correlation is recommended. No evidence of bowel obstruction. Normal appendix. The abdominal aorta and IVC appear unremarkable. No portal venous gas identified. There is no adenopathy. There is a small fat containing umbilical hernia. The abdominal wall soft tissues appear unremarkable. The osseous structures are intact. IMPRESSION: Findings most compatible with acute pancreatitis.  No abscess. Mild haziness of the biliary trees likely extension of inflammation of the pancreas. An inflammatory/infectious involving the biliary tree as the primary pathology is less likely but not excluded. Clinical correlation is recommended. Indeterminate right adrenal nodule. MRI is recommended further characterization. Nonobstructing left renal inferior pole calculus. Sigmoid diverticulosis. Electronically Signed   By: Anner Crete M.D.   On: 03/21/2015 19:45   US Abdomen Limited  03/21/2015  CLINICAL DATA:  Abdominal pain. Pancreatitis. Rule out cholecystitis. EXAM: US ABDOMEN LIMITED - RIGHT UPPER QUADRANT COMPARISON:  None. FINDINGS: Gallbladder: Bold contracted gallbladder filled with gallstones. Negative sonographic Murphy sign. Gallbladder wall not thickened 2.5 mm Common bile duct: Diameter: 7.7 mm.  Mild common bile duct dilatation. Liver: No focal liver lesion.  Mild ductal dilatation  IMPRESSION: Cholelithiasis without evidence of gallbladder wall thickening or pain over the gallbladder Mild biliary dilatation. Correlate with liver function tests. Common bile duct stone not excluded. Electronically Signed   By: Franchot Gallo M.D.   On: 03/21/2015 18:39    Medications / Allergies:  Scheduled Meds: . enoxaparin (LOVENOX) injection  0.5 mg/kg Subcutaneous Q24H  . piperacillin-tazobactam (ZOSYN)  IV  3.375 g Intravenous 3 times per day   Continuous Infusions:  PRN Meds:.acetaminophen **OR** [DISCONTINUED] acetaminophen, hydrALAZINE, HYDROmorphone (DILAUDID) injection, ondansetron **OR** ondansetron (ZOFRAN) IV, sodium chloride  Antibiotics: Anti-infectives    Start     Dose/Rate Route Frequency Ordered Stop   03/21/15 2215  piperacillin-tazobactam (ZOSYN) IVPB 3.375 g     3.375 g 12.5 mL/hr over 240 Minutes Intravenous 3 times per day 03/21/15 2206          Assessment/Plan Biliary pancreatitis  Abnormal LFTs Hyperbilirubinemia Await AM labs and MRCP results.  Have tried to call radiology to obtain results, will attempt again.  Remains tender on exam.  Lipase down to 86(12/26).  Surgical recommendations to follow pending above results and improvement in symptoms.  FEN-NPO, add IVF VTE prophylaxis-SCD/lovenox ID-zosyn   Erby Pian, [facility] Pager [phone]) For consults and floor pages call [phone]([id number])  03/23/2015 8:27 AM

## 2015-03-24 ENCOUNTER — Encounter (HOSPITAL_COMMUNITY)
Admission: EM | Disposition: A | Discharge status: Home / Self Care | Payer: Self-pay | Source: Home / Self Care | Attending: Internal Medicine

## 2015-03-24 DIAGNOSIS — K859 Acute pancreatitis without necrosis or infection, unspecified: Secondary | ICD-10-CM

## 2015-03-24 LAB — GLUCOSE, CAPILLARY
GLUCOSE-CAPILLARY: 107 mg/dL — AB (ref 65–99)
GLUCOSE-CAPILLARY: 112 mg/dL — AB (ref 65–99)
Glucose-Capillary: 105 mg/dL — ABNORMAL HIGH (ref 65–99)
Glucose-Capillary: 121 mg/dL — ABNORMAL HIGH (ref 65–99)

## 2015-03-24 LAB — COMPREHENSIVE METABOLIC PANEL
ALBUMIN: 2.9 g/dL — AB (ref 3.5–5.0)
ALT: 73 U/L — ABNORMAL HIGH (ref 17–63)
ANION GAP: 10 (ref 5–15)
AST: 24 U/L (ref 15–41)
Alkaline Phosphatase: 201 U/L — ABNORMAL HIGH (ref 38–126)
BUN: 8 mg/dL (ref 6–20)
CHLORIDE: 100 mmol/L — AB (ref 101–111)
CO2: 25 mmol/L (ref 22–32)
Calcium: 9 mg/dL (ref 8.9–10.3)
Creatinine, Ser: 1.06 mg/dL (ref 0.61–1.24)
GFR calc Af Amer: >60 mL/min (ref >60)
GFR calc non Af Amer: >60 mL/min (ref >60)
GLUCOSE: 107 mg/dL — AB (ref 65–99)
POTASSIUM: 3.6 mmol/L (ref 3.5–5.1)
SODIUM: 135 mmol/L (ref 135–145)
Total Bilirubin: 2.1 mg/dL — ABNORMAL HIGH (ref 0.3–1.2)
Total Protein: 6.8 g/dL (ref 6.5–8.1)

## 2015-03-24 LAB — CBC
HEMATOCRIT: 39.1 % (ref 39.0–52.0)
HEMOGLOBIN: 13.1 g/dL (ref 13.0–17.0)
MCH: 26.2 pg (ref 26.0–34.0)
MCHC: 33.5 g/dL (ref 30.0–36.0)
MCV: 78.2 fL (ref 78.0–100.0)
Platelets: 285 10*3/uL (ref 150–400)
RBC: 5 MIL/uL (ref 4.22–5.81)
RDW: 14.7 % (ref 11.5–15.5)
WBC: 17.8 10*3/uL — ABNORMAL HIGH (ref 4.0–10.5)

## 2015-03-24 LAB — SURGICAL PCR SCREEN
MRSA, PCR: NEGATIVE
STAPHYLOCOCCUS AUREUS: NEGATIVE

## 2015-03-24 SURGERY — ERCP, WITH INTERVENTION IF INDICATED
Anesthesia: General

## 2015-03-24 MED ORDER — DEXTROSE 5 % IV SOLN
2.0000 g | INTRAVENOUS | Status: AC
  Administered 2015-03-25: 2 g via INTRAVENOUS
  Filled 2015-03-24: qty 2

## 2015-03-24 NOTE — Progress Notes (Signed)
Subjective: Abdominal pain each day continues to improve. Tolerating clear liquids.  Objective: Vital signs in last 24 hours: Temp:  [98.9 F (37.2 C)] 98.9 F (37.2 C) (12/28 0539) Pulse Rate:  [72-83] 72 (12/28 0539) Resp:  [18-21] 18 (12/28 0539) BP: (150-172)/(78-82) 150/78 mmHg (12/28 0539) SpO2:  [98 %] 98 % (12/28 0539) Weight change:  Last BM Date: 03/21/15  PE: GEN:  Overweight, NAD HEENT:  Faint scleral icterus ABD:  Soft, mild epigastric tenderness  Lab Results: CBC    Component Value Date/Time   WBC 17.8* 03/24/2015 0542   RBC 5.00 03/24/2015 0542   HGB 13.1 03/24/2015 0542   HCT 39.1 03/24/2015 0542   PLT 285 03/24/2015 0542   MCV 78.2 03/24/2015 0542   MCH 26.2 03/24/2015 0542   MCHC 33.5 03/24/2015 0542   RDW 14.7 03/24/2015 0542   CMP     Component Value Date/Time   NA 135 03/24/2015 0542   K 3.6 03/24/2015 0542   CL 100* 03/24/2015 0542   CO2 25 03/24/2015 0542   GLUCOSE 107* 03/24/2015 0542   BUN 8 03/24/2015 0542   CREATININE 1.06 03/24/2015 0542   CALCIUM 9.0 03/24/2015 0542   PROT 6.8 03/24/2015 0542   ALBUMIN 2.9* 03/24/2015 0542   AST 24 03/24/2015 0542   ALT 73* 03/24/2015 0542   ALKPHOS 201* 03/24/2015 0542   BILITOT 2.1* 03/24/2015 0542   GFRNONAA >60 03/24/2015 0542   GFRAA >60 03/24/2015 0542   Assessment:  1.  Biliary pancreatitis, clinically essentially resolved. 2.  Elevated LFTs, improving, suspect passed bile duct stone. 3.  Gallstones.  Plan:  1.  Clear liquids OK for now. 2.  NPO after midnight. 3.  Plan for cholecystectomy with intraoperative cholangiogram tomorrow. 4.  [facility] will follow.   Freddy JakschOUTLAW,Dinora Hemm M 03/24/2015, 11:27 AM   Pager [phone] If no answer or after 5 PM call [phone]

## 2015-03-24 NOTE — Progress Notes (Signed)
Patient Demographics:    Tommy Payne, is a 49 y.o. male, DOB - 07-25-1965, ZOX:[id number]  Admit date - 03/21/2015   Admitting Physician Eduard Clos, MD  Outpatient Primary MD for the patient is No PCP Per Patient  LOS - 3   Chief Complaint  Patient presents with  . Abdominal Pain        Subjective:    Tommy Payne today has, No headache, No chest pain, +ve epigastric abdominal pain but overall much improved - No Nausea, No new weakness tingling or numbness, No Cough - SOB.     Assessment  & Plan :     1. Gallstone Acute pancreatitis with possible cholecystitis and possible CBD stone. Repeat CMP pending, does have leukocytosis which post likely is reactionary, MRCP and CT abdomen rule out any necrosis or pseudocyst will stop antibiotics, continue bowel rest with IV fluids, GI and CCS consult it. MRCP appears stable, per GI no ERCP, CCS to operate on 03/25/2015 for laparoscopic cholecystectomy. Patient denies any alcohol use lipid panel stable   He will be a low to moderate risk for adverse cardiopulmonary outcome during the perioperative period, he has excellent metastases, and climb 3-4 flights of stairs without any problems, does all his house chores himself and is active as a [employer]. No loud murmurs. No complications with excessive bleeding in the past. Baseline EKG nonacute.   2. History of smoking. Counseled to quit.   Code Status : Full  Family Communication  : Son bedisde  Disposition Plan  : Home 3-4 days  Consults  :  GI, CCS  Procedures  :   CT scan of the abdomen and pelvis along with right upper quadrant ultrasound. Showing pancreatitis, possible cholecystitis with possible CBD dilation. Also confirming cholelithiasis.  MRCP. Confirms pancreatitis but no CBD  stone, rules out active cholecystitis  DVT Prophylaxis  :  Lovenox   Lab Results  Component Value Date   PLT 285 03/24/2015    Inpatient Medications  Scheduled Meds: . bisacodyl  10 mg Oral Daily  . [START ON 03/25/2015] cefTRIAXone (ROCEPHIN)  IV  2 g Intravenous To SS-Surg  . enoxaparin (LOVENOX) injection  0.5 mg/kg Subcutaneous Q24H   Continuous Infusions: . sodium chloride 75 mL/hr at 03/24/15 0001   PRN Meds:.acetaminophen **OR** [DISCONTINUED] acetaminophen, hydrALAZINE, HYDROmorphone (DILAUDID) injection, ondansetron **OR** ondansetron (ZOFRAN) IV, sodium chloride  Antibiotics  :     Anti-infectives    Start     Dose/Rate Route Frequency Ordered Stop   03/25/15 0600  cefTRIAXone (ROCEPHIN) 2 g in dextrose 5 % 50 mL IVPB    Comments:  Pharmacy may adjust dosing strength, interval, or rate of medication as needed for optimal therapy for the patient Send with patient on call to the OR.  Anesthesia to complete antibiotic administration <5min prior to incision per [facility].   2 g 100 mL/hr over 30 Minutes Intravenous To [facility] 03/24/15 0813 03/26/15 0600   03/21/15 2215  piperacillin-tazobactam (ZOSYN) IVPB 3.375 g  Status:  Discontinued     3.375 g 12.5 mL/hr over 240 Minutes Intravenous 3 times per day 03/21/15 2206 03/23/15 1057        Objective:   Filed Vitals:   03/23/15 0514  03/23/15 1540 03/23/15 2133 03/24/15 0539  BP: 149/88 168/82 172/80 150/78  Pulse: 87 80 83 72  Temp: 98.6 F (37 C)  98.9 F (37.2 C) 98.9 F (37.2 C)  TempSrc: Oral  Oral Oral  Resp: Height:      Weight:      SpO2: 97% 98% 98% 98%    Wt Readings from Last 3 Encounters:  03/21/15 112.084 kg (247 lb 1.6 oz)  06/20/14 112.492 kg (248 lb)  06/28/13 110.315 kg (243 lb 3.2 oz)     Intake/Output Summary (Last 24 hours) at 03/24/15 1610 Last data filed at 03/24/15 0555  Gross per 24 hour  Intake 1806.25 ml  Output    900 ml  Net 906.25 ml      Physical Exam  Awake Alert, Oriented X 3, No new F.N deficits, Normal affect [place].AT,PERRAL Supple Neck,No JVD, No cervical lymphadenopathy appriciated.  Symmetrical Chest wall movement, Good air movement bilaterally, CTAB RRR,No Gallops,Rubs or new Murmurs, No Parasternal Heave +ve B.Sounds, Abd Soft, +ve epigastric and RUQ tenderness, No organomegaly appriciated, No rebound - guarding or rigidity. No Cyanosis, Clubbing or edema, No new Rash or bruise       Data Review:   Micro Results No results found for this or any previous visit (from the past 240 hour(s)).  Radiology Reports Ct Abdomen Pelvis W Contrast  03/21/2015  CLINICAL DATA:  49 year old male with lower abdominal pain and diaphoresis. No nausea or vomiting. EXAM: CT ABDOMEN AND PELVIS WITH CONTRAST TECHNIQUE: Multidetector CT imaging of the abdomen and pelvis was performed using the standard protocol following bolus administration of intravenous contrast. CONTRAST:  OMNIPAQUE IOHEXOL 300 MG/ML  SOLN COMPARISON:  Ultrasound dated 03/21/2015 FINDINGS: Minimal linear bibasilar dependent subsegmental atelectatic changes. The lung bases are otherwise clear. Top-normal cardiac size. No intra-abdominal free air or free fluid. The liver is unremarkable. There is faint haziness within the gallbladder fundus which may correspond to the stone seen on the ultrasound. There is mild dilatation of the CBD measuring up to 12 mm in diameter with normal distal tapering at the head of the pancreas. There is haziness of the wall of the biliary tree with haziness of adjacent fat. This is likely related to extension of the inflammatory changes of the pancreas. Sign An inflammatory/infectious etiology involving the gallbladder or biliary tree is not excluded. Clinical correlation is recommended. There is mild peripancreatic haziness. This finding in combination with elevated lipase level is compatible with acute pancreatitis. No drainable fluid  collection/abscess or pseudocyst identified. The spleen, and the left adrenal gland appear unremarkable. There is a 2.7 x 2.6 cm indeterminate heterogeneously enhancing right adrenal nodule. MRI is recommended for further characterization. There is a 1.3 cm nonobstructing left renal lower pole calculus. No hydronephrosis. The right kidney appears unremarkable. The visualized ureters and urinary bladder appear unremarkable. The prostate and seminal vesicles are grossly unremarkable. There is sigmoid diverticulosis with muscular hypertrophy. No active inflammation. There is apparent diffuse thickening of the distal colon, likely related to underdistention. Colitis is less likely but not excluded. Clinical correlation is recommended. No evidence of bowel obstruction. Normal appendix. The abdominal aorta and IVC appear unremarkable. No portal venous gas identified. There is no adenopathy. There is a small fat containing umbilical hernia. The abdominal wall soft tissues appear unremarkable. The osseous structures are intact. IMPRESSION: Findings most compatible with acute pancreatitis.  No abscess. Mild haziness of the biliary trees likely extension of inflammation of  the pancreas. An inflammatory/infectious involving the biliary tree as the primary pathology is less likely but not excluded. Clinical correlation is recommended. Indeterminate right adrenal nodule. MRI is recommended further characterization. Nonobstructing left renal inferior pole calculus. Sigmoid diverticulosis. Electronically Signed   By: Elgie CollardArash  Radparvar M.D.   On: 03/21/2015 19:45   Mr 3d Recon At Scanner  03/23/2015  CLINICAL DATA:  Acute pancreatitis EXAM: MRI ABDOMEN WITHOUT AND WITH CONTRAST (INCLUDING MRCP) TECHNIQUE: Multiplanar multisequence MR imaging of the abdomen was performed both before and after the administration of intravenous contrast. Heavily T2-weighted images of the biliary and pancreatic ducts were obtained, and  three-dimensional MRCP images were rendered by post processing. CONTRAST:  20mL MULTIHANCE GADOBENATE DIMEGLUMINE 529 MG/ML IV SOLN COMPARISON:  CT scan 03/21/2015 and ultrasound abdomen 03/21/2015. FINDINGS: Examination is limited by breathing motion artifact. Lower chest: Minimal dependent bibasilar atelectasis. No pleural effusion. The heart is upper limits of normal in size. No pericardial effusion. The distal esophagus is grossly normal. Hepatobiliary: No focal hepatic lesions or intrahepatic biliary dilatation. The common bile duct is mildly dilated in the porta hepatis at 7 mm. It tapers normally in the pancreatic head. No common bile duct stones are identified. The gallbladder is filled with small gallstones. No findings for acute cholecystitis. Pancreas: No mass or ductal dilatation. Mild diffuse inflammation. Normal pancreatic enhancement. No peripancreatic fluid collections. Spleen: Normal size.  No focal lesions. Adrenals/Urinary Tract: Right adrenal gland lesion as noted on the CT scan demonstrates signal loss on the out of phase T1 weighted gradient echo sequence consistent with a benign adenoma. The left adrenal gland is normal. Both kidneys are grossly normal. Stomach/Bowel: The stomach, duodenum, visualized small bowel and visualized colon are grossly normal. No inflammatory changes, mass lesions or obstructive findings. Vascular/Lymphatic: Small scattered mesenteric and retroperitoneal lymph nodes but no mass or adenopathy. The aorta and branch vessels are normal. Other: No ascites or worrisome fluid collections. Musculoskeletal: No significant bony findings. IMPRESSION: 1. Examination is limited by motion artifact. 2. Cholelithiasis without findings for acute cholecystitis. 3. Borderline common bile duct dilatation in the porta hepatis but normal in the head of the pancreas. No definite obstructing common bile duct stones are identified. 4. Mild inflammation of the pancreatic head. No  peripancreatic fluid collections or pancreatic necrosis. Electronically Signed   By: Rudie MeyerP.  Gallerani M.D.   On: 03/23/2015 08:24   [place] Abdomen Limited  03/21/2015  CLINICAL DATA:  Abdominal pain. Pancreatitis. Rule out cholecystitis. EXAM: US ABDOMEN LIMITED - RIGHT UPPER QUADRANT COMPARISON:  None. FINDINGS: Gallbladder: Bold contracted gallbladder filled with gallstones. Negative sonographic Murphy sign. Gallbladder wall not thickened 2.5 mm Common bile duct: Diameter: 7.7 mm.  Mild common bile duct dilatation. Liver: No focal liver lesion.  Mild ductal dilatation IMPRESSION: Cholelithiasis without evidence of gallbladder wall thickening or pain over the gallbladder Mild biliary dilatation. Correlate with liver function tests. Common bile duct stone not excluded. Electronically Signed   By: Marlan Palauharles  Clark M.D.   On: 03/21/2015 18:39   Mr Abd W/wo Cm/mrcp  03/23/2015  CLINICAL DATA:  Acute pancreatitis EXAM: MRI ABDOMEN WITHOUT AND WITH CONTRAST (INCLUDING MRCP) TECHNIQUE: Multiplanar multisequence MR imaging of the abdomen was performed both before and after the administration of intravenous contrast. Heavily T2-weighted images of the biliary and pancreatic ducts were obtained, and three-dimensional MRCP images were rendered by post processing. CONTRAST:  20mL MULTIHANCE GADOBENATE DIMEGLUMINE 529 MG/ML IV SOLN COMPARISON:  CT scan 03/21/2015 and ultrasound abdomen 03/21/2015. FINDINGS: Examination is  limited by breathing motion artifact. Lower chest: Minimal dependent bibasilar atelectasis. No pleural effusion. The heart is upper limits of normal in size. No pericardial effusion. The distal esophagus is grossly normal. Hepatobiliary: No focal hepatic lesions or intrahepatic biliary dilatation. The common bile duct is mildly dilated in the porta hepatis at 7 mm. It tapers normally in the pancreatic head. No common bile duct stones are identified. The gallbladder is filled with small gallstones. No findings  for acute cholecystitis. Pancreas: No mass or ductal dilatation. Mild diffuse inflammation. Normal pancreatic enhancement. No peripancreatic fluid collections. Spleen: Normal size.  No focal lesions. Adrenals/Urinary Tract: Right adrenal gland lesion as noted on the CT scan demonstrates signal loss on the out of phase T1 weighted gradient echo sequence consistent with a benign adenoma. The left adrenal gland is normal. Both kidneys are grossly normal. Stomach/Bowel: The stomach, duodenum, visualized small bowel and visualized colon are grossly normal. No inflammatory changes, mass lesions or obstructive findings. Vascular/Lymphatic: Small scattered mesenteric and retroperitoneal lymph nodes but no mass or adenopathy. The aorta and branch vessels are normal. Other: No ascites or worrisome fluid collections. Musculoskeletal: No significant bony findings. IMPRESSION: 1. Examination is limited by motion artifact. 2. Cholelithiasis without findings for acute cholecystitis. 3. Borderline common bile duct dilatation in the porta hepatis but normal in the head of the pancreas. No definite obstructing common bile duct stones are identified. 4. Mild inflammation of the pancreatic head. No peripancreatic fluid collections or pancreatic necrosis. Electronically Signed   By: Rudie Meyer M.D.   On: 03/23/2015 08:24     CBC  Recent Labs Lab 03/21/15 1355 03/22/15 0920 03/23/15 0709 03/24/15 0542  WBC 8.4 14.0* 23.9* 17.8*  HGB 15.0 14.2 13.8 13.1  HCT 43.7 41.7 40.9 39.1  PLT 283 282 286 285  MCV 79.3 77.9* 78.4 78.2  MCH 27.2 26.5 26.4 26.2  MCHC 34.3 34.1 33.7 33.5  RDW 14.5 15.1 14.9 14.7    Chemistries   Recent Labs Lab 03/21/15 1355 03/22/15 0920 03/23/15 0709 03/24/15 0542  NA 138 138 136 135  K 3.8 4.7 3.9 3.6  CL 101 105 101 100*  CO2 28 21* 26 25  GLUCOSE 128* 91 118* 107*  BUN CREATININE 1.22 1.17 1.16 1.06  CALCIUM 9.6 9.0 9.1 9.0  AST 123* 85* 39 24  ALT 214* 165* 117*  73*  ALKPHOS 288* 290* 258* 201*  BILITOT 8.3* 6.2* 2.9* 2.1*   ------------------------------------------------------------------------------------------------------------------ estimated creatinine clearance is 102.4 mL/min (by C-G formula based on Cr of 1.06). ------------------------------------------------------------------------------------------------------------------ No results for input(s): HGBA1C in the last 72 hours. ------------------------------------------------------------------------------------------------------------------  Recent Labs  03/22/15 0920 03/23/15 0709  CHOL 211* 209*  HDL 91 95  LDLCALC 105* 102*  TRIG 73 59  CHOLHDL 2.3 2.2   ------------------------------------------------------------------------------------------------------------------ No results for input(s): TSH, T4TOTAL, T3FREE, THYROIDAB in the last 72 hours.  Invalid input(s): FREET3 ------------------------------------------------------------------------------------------------------------------ No results for input(s): VITAMINB12, FOLATE, FERRITIN, TIBC, IRON, RETICCTPCT in the last 72 hours.  Coagulation profile  Recent Labs Lab 03/22/15 0920  INR 1.00    No results for input(s): DDIMER in the last 72 hours.  Cardiac Enzymes No results for input(s): CKMB, TROPONINI, MYOGLOBIN in the last 168 hours.  Invalid input(s): CK ------------------------------------------------------------------------------------------------------------------ Invalid input(s): POCBNP   Time Spent in minutes   35   Susa Raring K M.D on 03/24/2015 at 9:22 AM  Between 7am to 7pm - Pager - [phone]  After 7pm go to [email or web address] -  password [facility]  [facility] -  Office  [phone]

## 2015-03-24 NOTE — Progress Notes (Signed)
Patient ID: Tommy Payne, male   DOB: [birth year], 49 y.o.   MRN: [id number]     [facility]      [address] [place], [place] [address]    Phone: [phone] FAX: [phone]     Subjective: Pain is better. Tolerated clears.  NPO since midnight. LFTs improving.    Objective:  Vital signs:  Filed Vitals:   03/23/15 0514 03/23/15 1540 03/23/15 2133 03/24/15 0539  BP: 149/88 168/82 172/80 150/78  Pulse: 87 80 83 72  Temp: 98.6 F (37 C)  98.9 F (37.2 C) 98.9 F (37.2 C)  TempSrc: Oral  Oral Oral  Resp: 16 21 18 18   Height:      Weight:      SpO2: 97% 98% 98% 98%    Last BM Date: 03/21/15  Intake/Output   Yesterday:  12/27 0701 - 12/28 0700 In: 1806.3 [P.O.:200; I.V.:1606.3] Out: 650 [Urine:650] This shift: I/O last 3 completed shifts: In: 1956.3 [P.O.:200; I.V.:1606.3; IV Piggyback:150] Out: 925 [Urine:925]    Physical Exam: General: Pt awake/alert/oriented x4 in no  acute distress Abdomen: Soft.  Nondistended. Very minimally ted   Problem List:   Principal Problem:   Pancreatitis, acute Active Problems:   Choledocholithiasis   Elevated blood pressure    Results:   Labs: Results for orders placed or performed during the hospital encounter of 03/21/15 (from the past 48 hour(s))  Lipase, blood     Status: Abnormal   Collection Time: 03/22/15  9:20 AM  Result Value Ref Range   Lipase 86 (H) 11 - 51 U/L  CBC     Status: Abnormal   Collection Time: 03/22/15  9:20 AM  Result Value Ref Range   WBC 14.0 (H) 4.0 - 10.5 K/uL   RBC 5.35 4.22 - 5.81 MIL/uL   Hemoglobin 14.2 13.0 - 17.0 g/dL   HCT 41.7 39.0 - 52.0 %   MCV 77.9 (L) 78.0 - 100.0 fL   MCH 26.5 26.0 - 34.0 pg   MCHC 34.1 30.0 - 36.0 g/dL   RDW 15.1 11.5 - 15.5 %   Platelets 282 150 - 400 K/uL  Lipid panel     Status: Abnormal   Collection Time: 03/22/15  9:20 AM  Result Value Ref Range   Cholesterol 211 (H) 0 - 200 mg/dL    Triglycerides 73 <150 mg/dL   HDL 91 >40 mg/dL   Total CHOL/HDL Ratio 2.3 RATIO   VLDL 15 0 - 40 mg/dL   LDL Cholesterol 105 (H) 0 - 99 mg/dL    Comment:        Total Cholesterol/HDL:CHD Risk Coronary Heart Disease Risk Table                     Men   Women  1/2 Average Risk   3.4   3.3  Average Risk       5.0   4.4  2 X Average Risk   9.6   7.1  3 X Average Risk  23.4   11.0        Use the calculated Patient Ratio above and the CHD Risk Table to determine the patient's CHD Risk.        ATP III CLASSIFICATION (LDL):  <100     mg/dL   Optimal  100-129  mg/dL   Near or Above  Optimal  130-159  mg/dL   Borderline  160-189  mg/dL   High  >190     mg/dL   Very High   Protime-INR     Status: None   Collection Time: 03/22/15  9:20 AM  Result Value Ref Range   Prothrombin Time 13.4 11.6 - 15.2 seconds   INR 1.00 0.00 - 1.49  Comprehensive metabolic panel     Status: Abnormal   Collection Time: 03/22/15  9:20 AM  Result Value Ref Range   Sodium 138 135 - 145 mmol/L   Potassium 4.7 3.5 - 5.1 mmol/L   Chloride 105 101 - 111 mmol/L   CO2 21 (L) 22 - 32 mmol/L   Glucose, Bld 91 65 - 99 mg/dL   BUN 8 6 - 20 mg/dL   Creatinine, Ser 1.17 0.61 - 1.24 mg/dL   Calcium 9.0 8.9 - 10.3 mg/dL   Total Protein 7.2 6.5 - 8.1 g/dL   Albumin 3.5 3.5 - 5.0 g/dL   AST 85 (H) 15 - 41 U/L   ALT 165 (H) 17 - 63 U/L   Alkaline Phosphatase 290 (H) 38 - 126 U/L   Total Bilirubin 6.2 (H) 0.3 - 1.2 mg/dL   GFR calc non Af Amer >60 >60 mL/min   GFR calc Af Amer >60 >60 mL/min    Comment: (NOTE) The eGFR has been calculated using the CKD EPI equation. This calculation has not been validated in all clinical situations. eGFR's persistently <60 mL/min signify possible Chronic Kidney Disease.    Anion gap 12 5 - 15  Glucose, capillary     Status: Abnormal   Collection Time: 03/22/15 12:09 PM  Result Value Ref Range   Glucose-Capillary 103 (H) 65 - 99 mg/dL  Glucose, capillary      Status: None   Collection Time: 03/22/15  6:54 PM  Result Value Ref Range   Glucose-Capillary 99 65 - 99 mg/dL  Glucose, capillary     Status: Abnormal   Collection Time: 03/23/15 12:06 AM  Result Value Ref Range   Glucose-Capillary 123 (H) 65 - 99 mg/dL  Glucose, capillary     Status: Abnormal   Collection Time: 03/23/15  6:21 AM  Result Value Ref Range   Glucose-Capillary 117 (H) 65 - 99 mg/dL  Comprehensive metabolic panel     Status: Abnormal   Collection Time: 03/23/15  7:09 AM  Result Value Ref Range   Sodium 136 135 - 145 mmol/L   Potassium 3.9 3.5 - 5.1 mmol/L   Chloride 101 101 - 111 mmol/L   CO2 26 22 - 32 mmol/L   Glucose, Bld 118 (H) 65 - 99 mg/dL   BUN 10 6 - 20 mg/dL   Creatinine, Ser 1.16 0.61 - 1.24 mg/dL   Calcium 9.1 8.9 - 10.3 mg/dL   Total Protein 7.5 6.5 - 8.1 g/dL   Albumin 3.2 (L) 3.5 - 5.0 g/dL   AST 39 15 - 41 U/L   ALT 117 (H) 17 - 63 U/L   Alkaline Phosphatase 258 (H) 38 - 126 U/L   Total Bilirubin 2.9 (H) 0.3 - 1.2 mg/dL   GFR calc non Af Amer >60 >60 mL/min   GFR calc Af Amer >60 >60 mL/min    Comment: (NOTE) The eGFR has been calculated using the CKD EPI equation. This calculation has not been validated in all clinical situations. eGFR's persistently <60 mL/min signify possible Chronic Kidney Disease.    Anion gap 9 5 - 15  CBC     Status: Abnormal   Collection Time: 03/23/15  7:09 AM  Result Value Ref Range   WBC 23.9 (H) 4.0 - 10.5 K/uL   RBC 5.22 4.22 - 5.81 MIL/uL   Hemoglobin 13.8 13.0 - 17.0 g/dL   HCT 40.9 39.0 - 52.0 %   MCV 78.4 78.0 - 100.0 fL   MCH 26.4 26.0 - 34.0 pg   MCHC 33.7 30.0 - 36.0 g/dL   RDW 14.9 11.5 - 15.5 %   Platelets 286 150 - 400 K/uL  Lipid panel     Status: Abnormal   Collection Time: 03/23/15  7:09 AM  Result Value Ref Range   Cholesterol 209 (H) 0 - 200 mg/dL   Triglycerides 59 <150 mg/dL   HDL 95 >40 mg/dL   Total CHOL/HDL Ratio 2.2 RATIO   VLDL 12 0 - 40 mg/dL   LDL Cholesterol 102 (H) 0 - 99  mg/dL    Comment:        Total Cholesterol/HDL:CHD Risk Coronary Heart Disease Risk Table                     Men   Women  1/2 Average Risk   3.4   3.3  Average Risk       5.0   4.4  2 X Average Risk   9.6   7.1  3 X Average Risk  23.4   11.0        Use the calculated Patient Ratio above and the CHD Risk Table to determine the patient's CHD Risk.        ATP III CLASSIFICATION (LDL):  <100     mg/dL   Optimal  100-129  mg/dL   Near or Above                    Optimal  130-159  mg/dL   Borderline  160-189  mg/dL   High  >190     mg/dL   Very High   Urinalysis, Routine w reflex microscopic (not at [facility])     Status: Abnormal   Collection Time: 03/23/15  8:30 AM  Result Value Ref Range   Color, Urine AMBER (A) YELLOW    Comment: BIOCHEMICALS MAY BE AFFECTED BY COLOR   APPearance HAZY (A) CLEAR   Specific Gravity, Urine 1.028 1.005 - 1.030   pH 5.5 5.0 - 8.0   Glucose, UA NEGATIVE NEGATIVE mg/dL   Hgb urine dipstick SMALL (A) NEGATIVE   Bilirubin Urine SMALL (A) NEGATIVE   Ketones, ur 15 (A) NEGATIVE mg/dL   Protein, ur NEGATIVE NEGATIVE mg/dL   Nitrite NEGATIVE NEGATIVE   Leukocytes, UA TRACE (A) NEGATIVE  Urine microscopic-add on     Status: Abnormal   Collection Time: 03/23/15  8:30 AM  Result Value Ref Range   Squamous Epithelial / LPF 6-30 (A) NONE SEEN   WBC, UA 0-5 0 - 5 WBC/hpf   RBC / HPF 6-30 0 - 5 RBC/hpf   Bacteria, UA FEW (A) NONE SEEN   Urine-Other TRICHOMONAS PRESENT   Glucose, capillary     Status: Abnormal   Collection Time: 03/23/15 12:11 PM  Result Value Ref Range   Glucose-Capillary 145 (H) 65 - 99 mg/dL  Glucose, capillary     Status: Abnormal   Collection Time: 03/23/15  6:36 PM  Result Value Ref Range   Glucose-Capillary 120 (H) 65 - 99 mg/dL  Glucose, capillary     Status:  Abnormal   Collection Time: 03/24/15 12:13 AM  Result Value Ref Range   Glucose-Capillary 121 (H) 65 - 99 mg/dL   Comment 1 Notify RN    Comment 2 Document in Chart    Glucose, capillary     Status: Abnormal   Collection Time: 03/24/15  5:37 AM  Result Value Ref Range   Glucose-Capillary 105 (H) 65 - 99 mg/dL   Comment 1 Notify RN    Comment 2 Document in Chart   Comprehensive metabolic panel     Status: Abnormal   Collection Time: 03/24/15  5:42 AM  Result Value Ref Range   Sodium 135 135 - 145 mmol/L   Potassium 3.6 3.5 - 5.1 mmol/L   Chloride 100 (L) 101 - 111 mmol/L   CO2 25 22 - 32 mmol/L   Glucose, Bld 107 (H) 65 - 99 mg/dL   BUN 8 6 - 20 mg/dL   Creatinine, Ser 1.06 0.61 - 1.24 mg/dL   Calcium 9.0 8.9 - 10.3 mg/dL   Total Protein 6.8 6.5 - 8.1 g/dL   Albumin 2.9 (L) 3.5 - 5.0 g/dL   AST 24 15 - 41 U/L   ALT 73 (H) 17 - 63 U/L   Alkaline Phosphatase 201 (H) 38 - 126 U/L   Total Bilirubin 2.1 (H) 0.3 - 1.2 mg/dL   GFR calc non Af Amer >60 >60 mL/min   GFR calc Af Amer >60 >60 mL/min    Comment: (NOTE) The eGFR has been calculated using the CKD EPI equation. This calculation has not been validated in all clinical situations. eGFR's persistently <60 mL/min signify possible Chronic Kidney Disease.    Anion gap 10 5 - 15  CBC     Status: Abnormal   Collection Time: 03/24/15  5:42 AM  Result Value Ref Range   WBC 17.8 (H) 4.0 - 10.5 K/uL   RBC 5.00 4.22 - 5.81 MIL/uL   Hemoglobin 13.1 13.0 - 17.0 g/dL   HCT 39.1 39.0 - 52.0 %   MCV 78.2 78.0 - 100.0 fL   MCH 26.2 26.0 - 34.0 pg   MCHC 33.5 30.0 - 36.0 g/dL   RDW 14.7 11.5 - 15.5 %   Platelets 285 150 - 400 K/uL    Imaging / Studies: Mr 3d Recon At Scanner  03-Apr-2015  CLINICAL DATA:  Acute pancreatitis EXAM: MRI ABDOMEN WITHOUT AND WITH CONTRAST (INCLUDING MRCP) TECHNIQUE: Multiplanar multisequence MR imaging of the abdomen was performed both before and after the administration of intravenous contrast. Heavily T2-weighted images of the biliary and pancreatic ducts were obtained, and three-dimensional MRCP images were rendered by post processing. CONTRAST:  90m MULTIHANCE  GADOBENATE DIMEGLUMINE 529 MG/ML IV SOLN COMPARISON:  CT scan 03/21/2015 and ultrasound abdomen 03/21/2015. FINDINGS: Examination is limited by breathing motion artifact. Lower chest: Minimal dependent bibasilar atelectasis. No pleural effusion. The heart is upper limits of normal in size. No pericardial effusion. The distal esophagus is grossly normal. Hepatobiliary: No focal hepatic lesions or intrahepatic biliary dilatation. The common bile duct is mildly dilated in the porta hepatis at 7 mm. It tapers normally in the pancreatic head. No common bile duct stones are identified. The gallbladder is filled with small gallstones. No findings for acute cholecystitis. Pancreas: No mass or ductal dilatation. Mild diffuse inflammation. Normal pancreatic enhancement. No peripancreatic fluid collections. Spleen: Normal size.  No focal lesions. Adrenals/Urinary Tract: Right adrenal gland lesion as noted on the CT scan demonstrates signal loss on the out of phase  T1 weighted gradient echo sequence consistent with a benign adenoma. The left adrenal gland is normal. Both kidneys are grossly normal. Stomach/Bowel: The stomach, duodenum, visualized small bowel and visualized colon are grossly normal. No inflammatory changes, mass lesions or obstructive findings. Vascular/Lymphatic: Small scattered mesenteric and retroperitoneal lymph nodes but no mass or adenopathy. The aorta and branch vessels are normal. Other: No ascites or worrisome fluid collections. Musculoskeletal: No significant bony findings. IMPRESSION: 1. Examination is limited by motion artifact. 2. Cholelithiasis without findings for acute cholecystitis. 3. Borderline common bile duct dilatation in the porta hepatis but normal in the head of the pancreas. No definite obstructing common bile duct stones are identified. 4. Mild inflammation of the pancreatic head. No peripancreatic fluid collections or pancreatic necrosis. Electronically Signed   By: Marijo Sanes M.D.    On: 03/23/2015 08:24   Mr Abd W/wo Cm/mrcp  03/23/2015  CLINICAL DATA:  Acute pancreatitis EXAM: MRI ABDOMEN WITHOUT AND WITH CONTRAST (INCLUDING MRCP) TECHNIQUE: Multiplanar multisequence MR imaging of the abdomen was performed both before and after the administration of intravenous contrast. Heavily T2-weighted images of the biliary and pancreatic ducts were obtained, and three-dimensional MRCP images were rendered by post processing. CONTRAST:  34m MULTIHANCE GADOBENATE DIMEGLUMINE 529 MG/ML IV SOLN COMPARISON:  CT scan 03/21/2015 and ultrasound abdomen 03/21/2015. FINDINGS: Examination is limited by breathing motion artifact. Lower chest: Minimal dependent bibasilar atelectasis. No pleural effusion. The heart is upper limits of normal in size. No pericardial effusion. The distal esophagus is grossly normal. Hepatobiliary: No focal hepatic lesions or intrahepatic biliary dilatation. The common bile duct is mildly dilated in the porta hepatis at 7 mm. It tapers normally in the pancreatic head. No common bile duct stones are identified. The gallbladder is filled with small gallstones. No findings for acute cholecystitis. Pancreas: No mass or ductal dilatation. Mild diffuse inflammation. Normal pancreatic enhancement. No peripancreatic fluid collections. Spleen: Normal size.  No focal lesions. Adrenals/Urinary Tract: Right adrenal gland lesion as noted on the CT scan demonstrates signal loss on the out of phase T1 weighted gradient echo sequence consistent with a benign adenoma. The left adrenal gland is normal. Both kidneys are grossly normal. Stomach/Bowel: The stomach, duodenum, visualized small bowel and visualized colon are grossly normal. No inflammatory changes, mass lesions or obstructive findings. Vascular/Lymphatic: Small scattered mesenteric and retroperitoneal lymph nodes but no mass or adenopathy. The aorta and branch vessels are normal. Other: No ascites or worrisome fluid collections.  Musculoskeletal: No significant bony findings. IMPRESSION: 1. Examination is limited by motion artifact. 2. Cholelithiasis without findings for acute cholecystitis. 3. Borderline common bile duct dilatation in the porta hepatis but normal in the head of the pancreas. No definite obstructing common bile duct stones are identified. 4. Mild inflammation of the pancreatic head. No peripancreatic fluid collections or pancreatic necrosis. Electronically Signed   By: PMarijo SanesM.D.   On: 03/23/2015 08:24    Medications / Allergies:  Scheduled Meds: . bisacodyl  10 mg Oral Daily  . enoxaparin (LOVENOX) injection  0.5 mg/kg Subcutaneous Q24H   Continuous Infusions: . sodium chloride 75 mL/hr at 03/24/15 0001   PRN Meds:.acetaminophen **OR** [DISCONTINUED] acetaminophen, hydrALAZINE, HYDROmorphone (DILAUDID) injection, ondansetron **OR** ondansetron (ZOFRAN) IV, sodium chloride  Antibiotics: Anti-infectives    Start     Dose/Rate Route Frequency Ordered Stop   03/21/15 2215  piperacillin-tazobactam (ZOSYN) IVPB 3.375 g  Status:  Discontinued     3.375 g 12.5 mL/hr over 240 Minutes Intravenous 3 times per day  03/21/15 2206 03/23/15 1057       Assessment/Plan Biliary pancreatitis  Abnormal LFTs Hyperbilirubinemia Clinically improving.  Plan for cholecystectomy with IOC  tomorrow.  Surgical risks discussed including infection, bleeding, injury to surrounding structures, anesthesia risks.  The patient verbalizes understanding and wishes to proceed. Check AM labs.  FEN-fulls, NPO after midnight  VTE prophylaxis-SCD/lovenox   Erby Pian, [facility] Pager [phone]) For consults and floor pages call [phone](7A-4:30P)  03/24/2015 7:54 AM

## 2015-03-25 ENCOUNTER — Encounter (HOSPITAL_COMMUNITY): Payer: Self-pay | Admitting: Certified Registered Nurse Anesthetist

## 2015-03-25 ENCOUNTER — Inpatient Hospital Stay (HOSPITAL_COMMUNITY): Payer: No Typology Code available for payment source | Admitting: Anesthesiology

## 2015-03-25 ENCOUNTER — Encounter (HOSPITAL_COMMUNITY)
Admission: EM | Disposition: A | Discharge status: Home / Self Care | Payer: Self-pay | Source: Home / Self Care | Attending: Internal Medicine

## 2015-03-25 HISTORY — PX: CHOLECYSTECTOMY: SHX55

## 2015-03-25 LAB — CBC
HEMATOCRIT: 37 % — AB (ref 39.0–52.0)
HEMOGLOBIN: 12.2 g/dL — AB (ref 13.0–17.0)
MCH: 26 pg (ref 26.0–34.0)
MCHC: 33 g/dL (ref 30.0–36.0)
MCV: 78.9 fL (ref 78.0–100.0)
PLATELETS: 298 10*3/uL (ref 150–400)
RBC: 4.69 MIL/uL (ref 4.22–5.81)
RDW: 14.8 % (ref 11.5–15.5)
WBC: 13.1 10*3/uL — AB (ref 4.0–10.5)

## 2015-03-25 LAB — COMPREHENSIVE METABOLIC PANEL
ALT: 53 U/L (ref 17–63)
ANION GAP: 10 (ref 5–15)
AST: 25 U/L (ref 15–41)
Albumin: 2.7 g/dL — ABNORMAL LOW (ref 3.5–5.0)
Alkaline Phosphatase: 173 U/L — ABNORMAL HIGH (ref 38–126)
BUN: 9 mg/dL (ref 6–20)
CHLORIDE: 100 mmol/L — AB (ref 101–111)
CO2: 26 mmol/L (ref 22–32)
CREATININE: 1.02 mg/dL (ref 0.61–1.24)
Calcium: 8.9 mg/dL (ref 8.9–10.3)
Glucose, Bld: 102 mg/dL — ABNORMAL HIGH (ref 65–99)
POTASSIUM: 4.1 mmol/L (ref 3.5–5.1)
SODIUM: 136 mmol/L (ref 135–145)
Total Bilirubin: 1.7 mg/dL — ABNORMAL HIGH (ref 0.3–1.2)
Total Protein: 6.8 g/dL (ref 6.5–8.1)

## 2015-03-25 LAB — GLUCOSE, CAPILLARY
GLUCOSE-CAPILLARY: 92 mg/dL (ref 65–99)
Glucose-Capillary: 101 mg/dL — ABNORMAL HIGH (ref 65–99)
Glucose-Capillary: 97 mg/dL (ref 65–99)

## 2015-03-25 LAB — LIPASE, BLOOD: Lipase: 43 U/L (ref 11–51)

## 2015-03-25 SURGERY — LAPAROSCOPIC CHOLECYSTECTOMY WITH INTRAOPERATIVE CHOLANGIOGRAM
Anesthesia: General | Site: Abdomen

## 2015-03-25 MED ORDER — PROPOFOL 10 MG/ML IV BOLUS
INTRAVENOUS | Status: DC | PRN
  Administered 2015-03-25: 200 mg via INTRAVENOUS

## 2015-03-25 MED ORDER — SUGAMMADEX SODIUM 200 MG/2ML IV SOLN
INTRAVENOUS | Status: DC | PRN
  Administered 2015-03-25: 200 mg via INTRAVENOUS

## 2015-03-25 MED ORDER — 0.9 % SODIUM CHLORIDE (POUR BTL) OPTIME
TOPICAL | Status: DC | PRN
  Administered 2015-03-25: 1000 mL

## 2015-03-25 MED ORDER — FENTANYL CITRATE (PF) 100 MCG/2ML IJ SOLN
INTRAMUSCULAR | Status: DC | PRN
  Administered 2015-03-25 (×3): 50 ug via INTRAVENOUS
  Administered 2015-03-25: 100 ug via INTRAVENOUS
  Administered 2015-03-25 (×5): 50 ug via INTRAVENOUS

## 2015-03-25 MED ORDER — ROCURONIUM BROMIDE 100 MG/10ML IV SOLN
INTRAVENOUS | Status: DC | PRN
  Administered 2015-03-25: 40 mg via INTRAVENOUS
  Administered 2015-03-25 (×2): 10 mg via INTRAVENOUS

## 2015-03-25 MED ORDER — ONDANSETRON HCL 4 MG/2ML IJ SOLN
INTRAMUSCULAR | Status: DC | PRN
Start: 2015-03-25 — End: 2015-03-25
  Administered 2015-03-25: 4 mg via INTRAVENOUS

## 2015-03-25 MED ORDER — PROPOFOL 10 MG/ML IV BOLUS
INTRAVENOUS | Status: AC
  Filled 2015-03-25: qty 20

## 2015-03-25 MED ORDER — MIDAZOLAM HCL 2 MG/2ML IJ SOLN
INTRAMUSCULAR | Status: AC
  Filled 2015-03-25: qty 2

## 2015-03-25 MED ORDER — FENTANYL CITRATE (PF) 100 MCG/2ML IJ SOLN
INTRAMUSCULAR | Status: AC
  Administered 2015-03-25: 15:00:00
  Filled 2015-03-25: qty 2

## 2015-03-25 MED ORDER — PHENYLEPHRINE 40 MCG/ML (10ML) SYRINGE FOR IV PUSH (FOR BLOOD PRESSURE SUPPORT)
PREFILLED_SYRINGE | INTRAVENOUS | Status: AC
  Filled 2015-03-25: qty 10

## 2015-03-25 MED ORDER — FENTANYL CITRATE (PF) 250 MCG/5ML IJ SOLN
INTRAMUSCULAR | Status: AC
  Filled 2015-03-25: qty 5

## 2015-03-25 MED ORDER — LACTATED RINGERS IV SOLN
INTRAVENOUS | Status: DC
  Administered 2015-03-25 (×2): via INTRAVENOUS

## 2015-03-25 MED ORDER — ONDANSETRON HCL 4 MG/2ML IJ SOLN
INTRAMUSCULAR | Status: AC
  Filled 2015-03-25: qty 2

## 2015-03-25 MED ORDER — EPHEDRINE SULFATE 50 MG/ML IJ SOLN
INTRAMUSCULAR | Status: AC
  Filled 2015-03-25: qty 1

## 2015-03-25 MED ORDER — DEXTROSE 5 % IV SOLN: 2.0000 g | Freq: Once | INTRAVENOUS | Status: DC

## 2015-03-25 MED ORDER — BUPIVACAINE-EPINEPHRINE 0.25% -1:200000 IJ SOLN
INTRAMUSCULAR | Status: DC | PRN
  Administered 2015-03-25: 30 mL

## 2015-03-25 MED ORDER — HYDROMORPHONE HCL 1 MG/ML IJ SOLN
INTRAMUSCULAR | Status: AC
  Administered 2015-03-25: 15:00:00
  Filled 2015-03-25: qty 2

## 2015-03-25 MED ORDER — ONDANSETRON HCL 4 MG/2ML IJ SOLN: 4.0000 mg | Freq: Once | INTRAMUSCULAR | Status: DC | PRN

## 2015-03-25 MED ORDER — ROCURONIUM BROMIDE 50 MG/5ML IV SOLN
INTRAVENOUS | Status: AC
Start: 2015-03-25 — End: 2015-03-25
  Filled 2015-03-25: qty 2

## 2015-03-25 MED ORDER — HYDROMORPHONE HCL 1 MG/ML IJ SOLN: 0.2500 mg | INTRAMUSCULAR | Status: DC | PRN

## 2015-03-25 MED ORDER — PHENYLEPHRINE HCL 10 MG/ML IJ SOLN
INTRAMUSCULAR | Status: DC | PRN
  Administered 2015-03-25: 120 ug via INTRAVENOUS

## 2015-03-25 MED ORDER — LIDOCAINE HCL (CARDIAC) 20 MG/ML IV SOLN
INTRAVENOUS | Status: AC
  Filled 2015-03-25: qty 5

## 2015-03-25 MED ORDER — METOPROLOL TARTRATE 1 MG/ML IV SOLN: 5.0000 mg | INTRAVENOUS | Status: DC | PRN

## 2015-03-25 MED ORDER — SUGAMMADEX SODIUM 200 MG/2ML IV SOLN
INTRAVENOUS | Status: AC
  Filled 2015-03-25: qty 2

## 2015-03-25 MED ORDER — DEXTROSE 5 % IV SOLN
2.0000 g | INTRAVENOUS | Status: DC
  Filled 2015-03-25: qty 2

## 2015-03-25 MED ORDER — SODIUM CHLORIDE 0.9 % IR SOLN
Status: DC | PRN
  Administered 2015-03-25: 1000 mL

## 2015-03-25 MED ORDER — LIDOCAINE HCL (CARDIAC) 20 MG/ML IV SOLN
INTRAVENOUS | Status: DC | PRN
  Administered 2015-03-25: 50 mg via INTRAVENOUS

## 2015-03-25 MED ORDER — SODIUM CHLORIDE 0.9 % IV SOLN
INTRAVENOUS | Status: DC | PRN
  Administered 2015-03-25: 11:00:00

## 2015-03-25 MED ORDER — MIDAZOLAM HCL 5 MG/5ML IJ SOLN
INTRAMUSCULAR | Status: DC | PRN
  Administered 2015-03-25: 2 mg via INTRAVENOUS

## 2015-03-25 MED ORDER — OXYCODONE-ACETAMINOPHEN 5-325 MG PO TABS
ORAL_TABLET | ORAL | Status: AC
  Administered 2015-03-25: 15:00:00
  Filled 2015-03-25: qty 2

## 2015-03-25 MED ORDER — SUCCINYLCHOLINE CHLORIDE 20 MG/ML IJ SOLN
INTRAMUSCULAR | Status: AC
  Filled 2015-03-25: qty 3

## 2015-03-25 MED ORDER — NEOSTIGMINE METHYLSULFATE 10 MG/10ML IV SOLN
INTRAVENOUS | Status: AC
  Filled 2015-03-25: qty 1

## 2015-03-25 MED ORDER — FENTANYL CITRATE (PF) 100 MCG/2ML IJ SOLN
25.0000 ug | INTRAMUSCULAR | Status: DC | PRN
  Administered 2015-03-25 (×3): 50 ug via INTRAVENOUS

## 2015-03-25 MED ORDER — OXYCODONE-ACETAMINOPHEN 5-325 MG PO TABS
1.0000 | ORAL_TABLET | ORAL | Status: DC | PRN
  Administered 2015-03-25 – 2015-03-26 (×2): 2 via ORAL
  Filled 2015-03-25: qty 2

## 2015-03-25 MED ORDER — HYDROMORPHONE HCL 1 MG/ML IJ SOLN
0.5000 mg | INTRAMUSCULAR | Status: DC | PRN
  Administered 2015-03-25: 1 mg via INTRAVENOUS
  Administered 2015-03-25 (×2): 0.5 mg via INTRAVENOUS

## 2015-03-25 MED ORDER — STERILE WATER FOR INJECTION IJ SOLN
INTRAMUSCULAR | Status: AC
  Filled 2015-03-25: qty 10

## 2015-03-25 MED ORDER — HEMOSTATIC AGENTS (NO CHARGE) OPTIME
TOPICAL | Status: DC | PRN
  Administered 2015-03-25 (×2): 1 via TOPICAL

## 2015-03-25 SURGICAL SUPPLY — 46 items
APPLIER CLIP 5 13 M/L LIGAMAX5 (MISCELLANEOUS) ×2
BLADE SURG ROTATE 9660 (MISCELLANEOUS) ×2 IMPLANT
CANISTER SUCTION 2500CC (MISCELLANEOUS) ×2 IMPLANT
CHLORAPREP W/TINT 26ML (MISCELLANEOUS) ×2 IMPLANT
CLIP APPLIE 5 13 M/L LIGAMAX5 (MISCELLANEOUS) ×1 IMPLANT
CLSR STERI-STRIP ANTIMIC 1/2X4 (GAUZE/BANDAGES/DRESSINGS) ×2 IMPLANT
COVER MAYO STAND STRL (DRAPES) ×2 IMPLANT
COVER SURGICAL LIGHT HANDLE (MISCELLANEOUS) ×2 IMPLANT
CUTTER FLEX LINEAR 45M (STAPLE) ×2 IMPLANT
DEVICE TROCAR PUNCTURE CLOSURE (ENDOMECHANICALS) ×2 IMPLANT
DRAPE C-ARM 42X72 X-RAY (DRAPES) ×2 IMPLANT
ELECT REM PT RETURN 9FT ADLT (ELECTROSURGICAL) ×2
ELECTRODE REM PT RTRN 9FT ADLT (ELECTROSURGICAL) ×1 IMPLANT
GLOVE BIO SURGEON STRL SZ7 (GLOVE) ×2 IMPLANT
GLOVE BIOGEL PI IND STRL 7.5 (GLOVE) ×1 IMPLANT
GLOVE BIOGEL PI IND STRL 8 (GLOVE) ×2 IMPLANT
GLOVE BIOGEL PI INDICATOR 7.5 (GLOVE) ×1
GLOVE BIOGEL PI INDICATOR 8 (GLOVE) ×2
GLOVE ECLIPSE 7.5 STRL STRAW (GLOVE) ×2 IMPLANT
GLOVE SURG SS PI 8.0 STRL IVOR (GLOVE) ×2 IMPLANT
GOWN STRL REUS W/ TWL LRG LVL3 (GOWN DISPOSABLE) ×5 IMPLANT
GOWN STRL REUS W/TWL LRG LVL3 (GOWN DISPOSABLE) ×5
KIT BASIN OR (CUSTOM PROCEDURE TRAY) ×2 IMPLANT
KIT ROOM TURNOVER OR (KITS) ×2 IMPLANT
LIQUID BAND (GAUZE/BANDAGES/DRESSINGS) ×2 IMPLANT
NS IRRIG 1000ML POUR BTL (IV SOLUTION) ×2 IMPLANT
PAD ARMBOARD 7.5X6 YLW CONV (MISCELLANEOUS) ×2 IMPLANT
POUCH RETRIEVAL ECOSAC 10 (ENDOMECHANICALS) ×1 IMPLANT
POUCH RETRIEVAL ECOSAC 10MM (ENDOMECHANICALS) ×1
RELOAD STAPLE TA45 3.5 REG BLU (ENDOMECHANICALS) ×2 IMPLANT
SCISSORS LAP 5X35 DISP (ENDOMECHANICALS) ×2 IMPLANT
SET CHOLANGIOGRAPH 5 50 .035 (SET/KITS/TRAYS/PACK) ×2 IMPLANT
SET IRRIG TUBING LAPAROSCOPIC (IRRIGATION / IRRIGATOR) ×2 IMPLANT
SLEEVE ENDOPATH XCEL 5M (ENDOMECHANICALS) ×4 IMPLANT
SPECIMEN JAR SMALL (MISCELLANEOUS) ×2 IMPLANT
STRIP CLOSURE SKIN 1/2X4 (GAUZE/BANDAGES/DRESSINGS) ×2 IMPLANT
SUT MNCRL AB 4-0 PS2 18 (SUTURE) ×4 IMPLANT
SUT VICRYL 0 UR6 27IN ABS (SUTURE) ×4 IMPLANT
TOWEL OR 17X24 6PK STRL BLUE (TOWEL DISPOSABLE) ×2 IMPLANT
TOWEL OR 17X26 10 PK STRL BLUE (TOWEL DISPOSABLE) ×2 IMPLANT
TRAY LAPAROSCOPIC MC (CUSTOM PROCEDURE TRAY) ×2 IMPLANT
TROCAR BLADELESS 11MM (ENDOMECHANICALS) ×2 IMPLANT
TROCAR BLADELESS 12MM (ENDOMECHANICALS) ×2 IMPLANT
TROCAR XCEL BLUNT TIP 100MML (ENDOMECHANICALS) ×2 IMPLANT
TROCAR XCEL NON-BLD 5MMX100MML (ENDOMECHANICALS) ×2 IMPLANT
TUBING INSUFFLATION (TUBING) ×2 IMPLANT

## 2015-03-25 NOTE — Anesthesia Postprocedure Evaluation (Signed)
Anesthesia Post Note  Patient: Tommy Payne  Procedure(s) Performed: Procedure(s) (LRB): LAPAROSCOPIC CHOLECYSTECTOMY  (N/A)  Patient location during evaluation: PACU Anesthesia Type: General Level of consciousness: awake and alert Pain management: pain level controlled Vital Signs Assessment: post-procedure vital signs reviewed and stable Respiratory status: spontaneous breathing, nonlabored ventilation, respiratory function stable and patient connected to nasal cannula oxygen Cardiovascular status: blood pressure returned to baseline and stable Postop Assessment: no signs of nausea or vomiting Anesthetic complications: no    Last Vitals:  Filed Vitals:   03/25/15 1415 03/25/15 1458  BP: 140/95 143/79  Pulse: 70 71  Temp: 36.7 C 36.4 C  Resp: 17 18    Last Pain:  Filed Vitals:   03/25/15 1458  PainSc: 3                  Chibuike Fleek JENNETTE

## 2015-03-25 NOTE — Op Note (Signed)
Preoperative diagnosis: gallstone pancreatitis Postoperative diagnosis: saa Procedure: laparoscopic cholecystectomy Surgeon: Dr Harden MoMatt Madlyn Crosby Anesthesia: general EBL: minimal Drains none Specimen gb and contents to pathology Complications: none Sponge count correct at completion Disposition to recovery stable  Indications: This is an [age] yof with gallstones and episode of pancreatitis that has resolved now. We discussed proceeding with lap chole.   Procedure: After informed consent was obtained the patient was taken to the operating room. He was already on antibiotics. Sequential compression devices were on his legs. He was placed under general anesthesia without complication. His abdomen was prepped and draped in the standard sterile surgical fashion. A surgical timeout was then performed.  I infiltrated marcaine below the umbilicus. I made an incision and then entered the fascia sharply. I then entered the peritoneum bluntly. There was no evidence of an entry injury. I placed a 0 vicryl pursestring suture and inserted a hasson trocar. I then inserted 3 further 5 mm trocars in the epigastrium and ruq.This was very difficult due to fact that he is obese, omentum was large and gallbladder was contracted and scarred in the liver. I was able to grasp the gallbladder and was able to eventually identify what I thought was the cystic artery as well as what ended up being the gallbladder fused to the common duct.  There was no real cystic duct present.  I decided to go to a dome down approach and I used cautery to remove the gallbladder from the liver bed.  The gallbladder fell apart with spillage of pus and stones. All stones were evacuated that I could identify during the case.  Eventually I clipped and divided the cystic artery.  I then took the gallbladder down to the common duct. I was able to visualize the common duct proximal and distal to the gallbladder.  I did not do a cholangiogram due to  this also.  I then used a gia stapler to divide the gallbladder off the common duct.  This was too inflamed just to put an endoloop on and clips were not going to work either.  I inspected the common duct after this and it was intact.  I then removed the gallbladder from the liver bed and placed it in a bag.. I sized the epigastric trocar up to a 12 mm to evacuate stones and use the stapler. It was then removed from the epigastric incision. I then obtained hemostasis and irrigated.. I placed surgicel snow in the gb fossa also. I then removed the umbilical trocar and closed with 0 vicryl and the endoclose device after tying down the pursestring.  I then desufflated the abdomen and removed all my remaining trocars. I then closed these with 4-0 Monocryl and Dermabond. He tolerated this well was extubated and transferred to the recovery room in stable condition

## 2015-03-25 NOTE — Progress Notes (Signed)
Cholecystectomy done today; unable to do cholangiogram; as long as LFTs continue to downtrend and clinical course improves, don't see need for ERCP.  Will sign-off; pleae call with questions; thank you for the consultation.

## 2015-03-25 NOTE — Progress Notes (Signed)
Day of Surgery  Subjective: Feels much better, no abd pain, had a bm yesterday  Objective: Vital signs in last 24 hours: Temp:  [97.7 F (36.5 C)-98.9 F (37.2 C)] 98.9 F (37.2 C) (12/29 0529) Pulse Rate:  [62-74] 67 (12/29 0757) Resp:  [18-24] 18 (12/29 0529) BP: (149-183)/(83-99) 149/83 mmHg (12/29 0757) SpO2:  [97 %-100 %] 100 % (12/29 0529) Last BM Date: 03/21/15  Intake/Output from previous day: 12/28 0701 - 12/29 0700 In: 300 [P.O.:300] Out: 1400 [Urine:1400] Intake/Output this shift:    GI: soft (much better), nontender  Lab Results:   Recent Labs  03/24/15 0542 03/25/15 0453  WBC 17.8* 13.1*  HGB 13.1 12.2*  HCT 39.1 37.0*  PLT 285 298   BMET  Recent Labs  03/24/15 0542 03/25/15 0453  NA 135 136  K 3.6 4.1  CL 100* 100*  CO2 25 26  GLUCOSE 107* 102*  BUN 8 9  CREATININE 1.06 1.02  CALCIUM 9.0 8.9   PT/INR  Recent Labs  03/22/15 0920  LABPROT 13.4  INR 1.00   ABG No results for input(s): PHART, HCO3 in the last 72 hours.  Invalid input(s): PCO2, PO2  Studies/Results: No results found.  Anti-infectives: Anti-infectives    Start     Dose/Rate Route Frequency Ordered Stop   03/25/15 0915  cefOXitin (MEFOXIN) 2 g in dextrose 5 % 50 mL IVPB  Status:  Discontinued    Comments:  Send to or with patient   2 g 100 mL/hr over 30 Minutes Intravenous  Once 03/25/15 0913 03/25/15 0913   03/25/15 0600  cefTRIAXone (ROCEPHIN) 2 g in dextrose 5 % 50 mL IVPB    Comments:  Pharmacy may adjust dosing strength, interval, or rate of medication as needed for optimal therapy for the patient Send with patient on call to the OR.  Anesthesia to complete antibiotic administration <7560min prior to [facility].   2 g 100 mL/hr over 30 Minutes Intravenous To [facility] 03/24/15 0813 03/26/15 0600   03/21/15 2215  piperacillin-tazobactam (ZOSYN) IVPB 3.375 g  Status:  Discontinued     3.375 g 12.5 mL/hr over 240 Minutes Intravenous 3  times per day 03/21/15 2206 03/23/15 1057      Assessment/Plan: Gallstone pancreatitis  Lap chole today, risks/benefits discussed  [facility] Center LLCWAKEFIELD,Raisa Ditto 03/25/2015

## 2015-03-25 NOTE — Transfer of Care (Signed)
Immediate Anesthesia Transfer of Care Note  Patient: Tommy Payne  Procedure(s) Performed: Procedure(s): LAPAROSCOPIC CHOLECYSTECTOMY  (N/A)  Patient Location: PACU  Anesthesia Type:General  Level of Consciousness: awake, alert , oriented and patient cooperative  Airway & Oxygen Therapy: Patient Spontanous Breathing and Patient connected to face mask oxygen  Post-op Assessment: Report given to RN, Post -op Vital signs reviewed and stable, Patient moving all extremities and Patient moving all extremities X 4  Post vital signs: Reviewed and stable  Last Vitals:  Filed Vitals:   03/25/15 0757 03/25/15 0950  BP: 149/83 140/71  Pulse: 67 65  Temp:    Resp:      Complications: No apparent anesthesia complications

## 2015-03-25 NOTE — Anesthesia Procedure Notes (Signed)
Procedure Name: Intubation Date/Time: 03/25/2015 11:17 AM Performed by: Adonis HousekeeperNGELL, Yidel Teuscher M Pre-anesthesia Checklist: Patient identified, Emergency Drugs available, Suction available and Patient being monitored Patient Re-evaluated:Patient Re-evaluated prior to inductionOxygen Delivery Method: Circle system utilized Preoxygenation: Pre-oxygenation with 100% oxygen Intubation Type: IV induction Ventilation: Mask ventilation without difficulty Laryngoscope Size: Glidescope and 3 Grade View: Grade I Tube type: Oral Tube size: 7.5 mm Number of attempts: 1 Airway Equipment and Method: Stylet and Video-laryngoscopy Placement Confirmation: ETT inserted through vocal cords under direct vision,  positive ETCO2 and breath sounds checked- equal and bilateral Secured at: 22 cm Tube secured with: Tape Dental Injury: Teeth and Oropharynx as per pre-operative assessment

## 2015-03-25 NOTE — Progress Notes (Signed)
Patient Demographics:    Tommy Payne, is a 49 y.o. male, DOB - 1965-10-31, ZOX:[id number]  Admit date - 03/21/2015   Admitting Physician Eduard Clos, MD  Outpatient Primary MD for the patient is No PCP Per Patient  LOS - 4   Chief Complaint  Patient presents with  . Abdominal Pain        Subjective:    Cleatis Polka today has, No headache, No chest pain, +ve epigastric abdominal pain but overall much improved - No Nausea, No new weakness tingling or numbness, No Cough - SOB.     Assessment  & Plan :     1. Gallstone Acute pancreatitis with possible cholecystitis and possible CBD stone. Repeat CMP pending, does have leukocytosis which post likely is reactionary, MRCP and CT abdomen rule out any necrosis or pseudocyst will stop antibiotics, continue bowel rest with IV fluids, GI and CCS consult it. MRCP appears stable, per GI no ERCP, CCS to operate later on 03/25/2015 for laparoscopic cholecystectomy. Patient denies any alcohol use lipid panel stable   He will be a low to moderate risk for adverse cardiopulmonary outcome during the perioperative period, he has excellent metastases, and climb 3-4 flights of stairs without any problems, does all his house chores himself and is active as a [employer]. No loud murmurs. No complications with excessive bleeding in the past. Baseline EKG nonacute.   2. History of smoking. Counseled to quit.   Code Status : Full  Family Communication  : Son bedisde  Disposition Plan  : Home 3-4 days  Consults  :  GI, CCS  Procedures  :   CT scan of the abdomen and pelvis along with right upper quadrant ultrasound. Showing pancreatitis, possible cholecystitis with possible CBD dilation. Also confirming cholelithiasis.  MRCP. Confirms pancreatitis but no  CBD stone, rules out active cholecystitis  DVT Prophylaxis  :  Lovenox   Lab Results  Component Value Date   PLT 298 03/25/2015    Inpatient Medications  Scheduled Meds: . [MAR Hold] bisacodyl  10 mg Oral Daily  . cefTRIAXone (ROCEPHIN)  IV  2 g Intravenous To SS-Surg  . [MAR Hold] enoxaparin (LOVENOX) injection  0.5 mg/kg Subcutaneous Q24H   Continuous Infusions: . sodium chloride 75 mL/hr at 03/25/15 0324  . lactated ringers 50 mL/hr at 03/25/15 1024   PRN Meds:.[MAR Hold] acetaminophen **OR** [DISCONTINUED] acetaminophen, [MAR Hold] hydrALAZINE, [MAR Hold]  HYDROmorphone (DILAUDID) injection, [MAR Hold] metoprolol, [MAR Hold] ondansetron **OR** [MAR Hold] ondansetron (ZOFRAN) IV, [MAR Hold] sodium chloride  Antibiotics  :     Anti-infectives    Start     Dose/Rate Route Frequency Ordered Stop   03/25/15 0915  cefOXitin (MEFOXIN) 2 g in dextrose 5 % 50 mL IVPB  Status:  Discontinued    Comments:  Send to or with patient   2 g 100 mL/hr over 30 Minutes Intravenous  Once 03/25/15 0913 03/25/15 0913   03/25/15 0600  cefTRIAXone (ROCEPHIN) 2 g in dextrose 5 % 50 mL IVPB    Comments:  Pharmacy may adjust dosing strength, interval, or rate of medication as needed for optimal therapy for the patient Send with patient on call to the OR.  Anesthesia to complete antibiotic administration <19min  [facility].   2 g 100 mL/hr over 30 Minutes Intravenous To [facility] 03/24/15 0813 03/26/15 0600   03/21/15 2215  piperacillin-tazobactam (ZOSYN) IVPB 3.375 g  Status:  Discontinued     3.375 g 12.5 mL/hr over 240 Minutes Intravenous 3 times per day 03/21/15 2206 03/23/15 1057        Objective:   Filed Vitals:   03/25/15 0101 03/25/15 0529 03/25/15 0757 03/25/15 0950  BP: 168/85 183/97 149/83 140/71  Pulse: 62 64 67 65  Temp:  98.9 F (37.2 C)    TempSrc:  Oral    Resp:  18    Height:      Weight:      SpO2:  100%      Wt Readings from Last 3  Encounters:  03/21/15 112.084 kg (247 lb 1.6 oz)  06/20/14 112.492 kg (248 lb)  06/28/13 110.315 kg (243 lb 3.2 oz)     Intake/Output Summary (Last 24 hours) at 03/25/15 1112 Last data filed at 03/25/15 0758  Gross per 24 hour  Intake    300 ml  Output   1400 ml  Net  -1100 ml     Physical Exam  Awake Alert, Oriented X 3, No new F.N deficits, Normal affect [place].AT,PERRAL Supple Neck,No JVD, No cervical lymphadenopathy appriciated.  Symmetrical Chest wall movement, Good air movement bilaterally, CTAB RRR,No Gallops,Rubs or new Murmurs, No Parasternal Heave +ve B.Sounds, Abd Soft, +ve epigastric and RUQ tenderness, No organomegaly appriciated, No rebound - guarding or rigidity. No Cyanosis, Clubbing or edema, No new Rash or bruise       Data Review:   Micro Results Recent Results (from the past 240 hour(s))  Surgical pcr screen     Status: None   Collection Time: 03/24/15  8:34 PM  Result Value Ref Range Status   MRSA, PCR NEGATIVE NEGATIVE Final   Staphylococcus aureus NEGATIVE NEGATIVE Final    Comment:        The Xpert SA Assay (FDA approved for NASAL specimens in patients over 49 years of age), is one component of a comprehensive surveillance program.  Test performance has been validated by [facility] for patients greater than or equal to 49 year old. It is not intended to diagnose infection nor to guide or monitor treatment.     Radiology Reports Ct Abdomen Pelvis W Contrast  03/21/2015  CLINICAL DATA:  49 year old male with lower abdominal pain and diaphoresis. No nausea or vomiting. EXAM: CT ABDOMEN AND PELVIS WITH CONTRAST TECHNIQUE: Multidetector CT imaging of the abdomen and pelvis was performed using the standard protocol following bolus administration of intravenous contrast. CONTRAST:  100mL OMNIPAQUE IOHEXOL 300 MG/ML  SOLN COMPARISON:  Ultrasound dated 03/21/2015 FINDINGS: Minimal linear bibasilar dependent subsegmental atelectatic changes. The lung  bases are otherwise clear. Top-normal cardiac size. No intra-abdominal free air or free fluid. The liver is unremarkable. There is faint haziness within the gallbladder fundus which may correspond to the stone seen on the ultrasound. There is mild dilatation of the CBD measuring up to 12 mm in diameter with normal distal tapering at the head of the pancreas. There is haziness of the wall of the biliary tree with haziness of adjacent fat. This is likely related to extension of the inflammatory changes of the pancreas. Sign An inflammatory/infectious etiology involving the gallbladder or biliary tree is not excluded. Clinical correlation is recommended. There is mild peripancreatic haziness. This finding in combination with elevated lipase level is  compatible with acute pancreatitis. No drainable fluid collection/abscess or pseudocyst identified. The spleen, and the left adrenal gland appear unremarkable. There is a 2.7 x 2.6 cm indeterminate heterogeneously enhancing right adrenal nodule. MRI is recommended for further characterization. There is a 1.3 cm nonobstructing left renal lower pole calculus. No hydronephrosis. The right kidney appears unremarkable. The visualized ureters and urinary bladder appear unremarkable. The prostate and seminal vesicles are grossly unremarkable. There is sigmoid diverticulosis with muscular hypertrophy. No active inflammation. There is apparent diffuse thickening of the distal colon, likely related to underdistention. Colitis is less likely but not excluded. Clinical correlation is recommended. No evidence of bowel obstruction. Normal appendix. The abdominal aorta and IVC appear unremarkable. No portal venous gas identified. There is no adenopathy. There is a small fat containing umbilical hernia. The abdominal wall soft tissues appear unremarkable. The osseous structures are intact. IMPRESSION: Findings most compatible with acute pancreatitis.  No abscess. Mild haziness of the  biliary trees likely extension of inflammation of the pancreas. An inflammatory/infectious involving the biliary tree as the primary pathology is less likely but not excluded. Clinical correlation is recommended. Indeterminate right adrenal nodule. MRI is recommended further characterization. Nonobstructing left renal inferior pole calculus. Sigmoid diverticulosis. Electronically Signed   By: Elgie Collard M.D.   On: 03/21/2015 19:45   Mr 3d Recon At Scanner  03/23/2015  CLINICAL DATA:  Acute pancreatitis EXAM: MRI ABDOMEN WITHOUT AND WITH CONTRAST (INCLUDING MRCP) TECHNIQUE: Multiplanar multisequence MR imaging of the abdomen was performed both before and after the administration of intravenous contrast. Heavily T2-weighted images of the biliary and pancreatic ducts were obtained, and three-dimensional MRCP images were rendered by post processing. CONTRAST:  20mL MULTIHANCE GADOBENATE DIMEGLUMINE 529 MG/ML IV SOLN COMPARISON:  CT scan 03/21/2015 and ultrasound abdomen 03/21/2015. FINDINGS: Examination is limited by breathing motion artifact. Lower chest: Minimal dependent bibasilar atelectasis. No pleural effusion. The heart is upper limits of normal in size. No pericardial effusion. The distal esophagus is grossly normal. Hepatobiliary: No focal hepatic lesions or intrahepatic biliary dilatation. The common bile duct is mildly dilated in the porta hepatis at 7 mm. It tapers normally in the pancreatic head. No common bile duct stones are identified. The gallbladder is filled with small gallstones. No findings for acute cholecystitis. Pancreas: No mass or ductal dilatation. Mild diffuse inflammation. Normal pancreatic enhancement. No peripancreatic fluid collections. Spleen: Normal size.  No focal lesions. Adrenals/Urinary Tract: Right adrenal gland lesion as noted on the CT scan demonstrates signal loss on the out of phase T1 weighted gradient echo sequence consistent with a benign adenoma. The left adrenal  gland is normal. Both kidneys are grossly normal. Stomach/Bowel: The stomach, duodenum, visualized small bowel and visualized colon are grossly normal. No inflammatory changes, mass lesions or obstructive findings. Vascular/Lymphatic: Small scattered mesenteric and retroperitoneal lymph nodes but no mass or adenopathy. The aorta and branch vessels are normal. Other: No ascites or worrisome fluid collections. Musculoskeletal: No significant bony findings. IMPRESSION: 1. Examination is limited by motion artifact. 2. Cholelithiasis without findings for acute cholecystitis. 3. Borderline common bile duct dilatation in the porta hepatis but normal in the head of the pancreas. No definite obstructing common bile duct stones are identified. 4. Mild inflammation of the pancreatic head. No peripancreatic fluid collections or pancreatic necrosis. Electronically Signed   By: Rudie Meyer M.D.   On: 03/23/2015 08:24   US Abdomen Limited  03/21/2015  CLINICAL DATA:  Abdominal pain. Pancreatitis. Rule out cholecystitis. EXAM: US ABDOMEN LIMITED -  RIGHT UPPER QUADRANT COMPARISON:  None. FINDINGS: Gallbladder: Bold contracted gallbladder filled with gallstones. Negative sonographic Murphy sign. Gallbladder wall not thickened 2.5 mm Common bile duct: Diameter: 7.7 mm.  Mild common bile duct dilatation. Liver: No focal liver lesion.  Mild ductal dilatation IMPRESSION: Cholelithiasis without evidence of gallbladder wall thickening or pain over the gallbladder Mild biliary dilatation. Correlate with liver function tests. Common bile duct stone not excluded. Electronically Signed   By: Marlan Palau M.D.   On: 03/21/2015 18:39   Mr Abd W/wo Cm/mrcp  03/23/2015  CLINICAL DATA:  Acute pancreatitis EXAM: MRI ABDOMEN WITHOUT AND WITH CONTRAST (INCLUDING MRCP) TECHNIQUE: Multiplanar multisequence MR imaging of the abdomen was performed both before and after the administration of intravenous contrast. Heavily T2-weighted images of  the biliary and pancreatic ducts were obtained, and three-dimensional MRCP images were rendered by post processing. CONTRAST:  20mL MULTIHANCE GADOBENATE DIMEGLUMINE 529 MG/ML IV SOLN COMPARISON:  CT scan 03/21/2015 and ultrasound abdomen 03/21/2015. FINDINGS: Examination is limited by breathing motion artifact. Lower chest: Minimal dependent bibasilar atelectasis. No pleural effusion. The heart is upper limits of normal in size. No pericardial effusion. The distal esophagus is grossly normal. Hepatobiliary: No focal hepatic lesions or intrahepatic biliary dilatation. The common bile duct is mildly dilated in the porta hepatis at 7 mm. It tapers normally in the pancreatic head. No common bile duct stones are identified. The gallbladder is filled with small gallstones. No findings for acute cholecystitis. Pancreas: No mass or ductal dilatation. Mild diffuse inflammation. Normal pancreatic enhancement. No peripancreatic fluid collections. Spleen: Normal size.  No focal lesions. Adrenals/Urinary Tract: Right adrenal gland lesion as noted on the CT scan demonstrates signal loss on the out of phase T1 weighted gradient echo sequence consistent with a benign adenoma. The left adrenal gland is normal. Both kidneys are grossly normal. Stomach/Bowel: The stomach, duodenum, visualized small bowel and visualized colon are grossly normal. No inflammatory changes, mass lesions or obstructive findings. Vascular/Lymphatic: Small scattered mesenteric and retroperitoneal lymph nodes but no mass or adenopathy. The aorta and branch vessels are normal. Other: No ascites or worrisome fluid collections. Musculoskeletal: No significant bony findings. IMPRESSION: 1. Examination is limited by motion artifact. 2. Cholelithiasis without findings for acute cholecystitis. 3. Borderline common bile duct dilatation in the porta hepatis but normal in the head of the pancreas. No definite obstructing common bile duct stones are identified. 4. Mild  inflammation of the pancreatic head. No peripancreatic fluid collections or pancreatic necrosis. Electronically Signed   By: Rudie Meyer M.D.   On: 03/23/2015 08:24     CBC  Recent Labs Lab 03/21/15 1355 03/22/15 0920 03/23/15 0709 03/24/15 0542 03/25/15 0453  WBC 8.4 14.0* 23.9* 17.8* 13.1*  HGB 15.0 14.2 13.8 13.1 12.2*  HCT 43.7 41.7 40.9 39.1 37.0*  PLT 283 282 286 285 298  MCV 79.3 77.9* 78.4 78.2 78.9  MCH 27.2 26.5 26.4 26.2 26.0  MCHC 34.3 34.1 33.7 33.5 33.0  RDW 14.5 15.1 14.9 14.7 14.8    Chemistries   Recent Labs Lab 03/21/15 1355 03/22/15 0920 03/23/15 0709 03/24/15 0542 03/25/15 0453  NA 138 138 136 135 136  K 3.8 4.7 3.9 3.6 4.1  CL 101 105 101 100* 100*  CO2 28 21* 26 25 26   GLUCOSE 128* 91 118* 107* 102*  BUN 9 8 10 8 9   CREATININE 1.22 1.17 1.16 1.06 1.02  CALCIUM 9.6 9.0 9.1 9.0 8.9  AST 123* 85* 39 24 25  ALT 214*  165* 117* 73* 53  ALKPHOS 288* 290* 258* 201* 173*  BILITOT 8.3* 6.2* 2.9* 2.1* 1.7*   ------------------------------------------------------------------------------------------------------------------ estimated creatinine clearance is 106.4 mL/min (by C-G formula based on Cr of 1.02). ------------------------------------------------------------------------------------------------------------------ No results for input(s): HGBA1C in the last 72 hours. ------------------------------------------------------------------------------------------------------------------  Recent Labs  03/23/15 0709  CHOL 209*  HDL 95  LDLCALC 102*  TRIG 59  CHOLHDL 2.2   ------------------------------------------------------------------------------------------------------------------ No results for input(s): TSH, T4TOTAL, T3FREE, THYROIDAB in the last 72 hours.  Invalid input(s): FREET3 ------------------------------------------------------------------------------------------------------------------ No results for input(s): VITAMINB12, FOLATE,  FERRITIN, TIBC, IRON, RETICCTPCT in the last 72 hours.  Coagulation profile  Recent Labs Lab 03/22/15 0920  INR 1.00    No results for input(s): DDIMER in the last 72 hours.  Cardiac Enzymes No results for input(s): CKMB, TROPONINI, MYOGLOBIN in the last 168 hours.  Invalid input(s): CK ------------------------------------------------------------------------------------------------------------------ Invalid input(s): POCBNP   Time Spent in minutes   35   Shlonda Dolloff K M.D on 03/25/2015 at 11:12 AM  Between 7am to 7pm - Pager - [phone]  After 7pm go to [email or web address] - password [facility]  [facility] -  Office  [phone]

## 2015-03-25 NOTE — Anesthesia Preprocedure Evaluation (Addendum)
Anesthesia Evaluation  Patient identified by MRN, date of birth, ID band Patient awake    Reviewed: Allergy & Precautions, NPO status , Patient's Chart, lab work & pertinent test results  History of Anesthesia Complications Negative for: history of anesthetic complications  Airway Mallampati: III  TM Distance: >3 FB Neck ROM: Full    Dental no notable dental hx. (+) Poor Dentition, Chipped, Missing,    Pulmonary Current Smoker,    Pulmonary exam normal breath sounds clear to auscultation       Cardiovascular negative cardio ROS Normal cardiovascular exam Rhythm:Regular Rate:Normal     Neuro/Psych negative neurological ROS  negative psych ROS   GI/Hepatic negative GI ROS, Neg liver ROS,   Endo/Other  obesity  Renal/GU negative Renal ROS  negative genitourinary   Musculoskeletal negative musculoskeletal ROS (+)   Abdominal   Peds negative pediatric ROS (+)  Hematology negative hematology ROS (+)   Anesthesia Other Findings   Reproductive/Obstetrics negative OB ROS                            Anesthesia Physical Anesthesia Plan  ASA: II  Anesthesia Plan: General   Post-op Pain Management:    Induction: Intravenous  Airway Management Planned: Oral ETT and Video Laryngoscope Planned  Additional Equipment:   Intra-op Plan:   Post-operative Plan: Extubation in OR  Informed Consent: I have reviewed the patients History and Physical, chart, labs and discussed the procedure including the risks, benefits and alternatives for the proposed anesthesia with the patient or authorized representative who has indicated his/her understanding and acceptance.   Dental advisory given  Plan Discussed with: CRNA  Anesthesia Plan Comments:        Anesthesia Quick Evaluation

## 2015-03-26 ENCOUNTER — Encounter (HOSPITAL_COMMUNITY): Payer: Self-pay | Admitting: General Surgery

## 2015-03-26 LAB — COMPREHENSIVE METABOLIC PANEL
ALT: 126 U/L — AB (ref 17–63)
ANION GAP: 14 (ref 5–15)
AST: 119 U/L — ABNORMAL HIGH (ref 15–41)
Albumin: 2.9 g/dL — ABNORMAL LOW (ref 3.5–5.0)
Alkaline Phosphatase: 365 U/L — ABNORMAL HIGH (ref 38–126)
BUN: 6 mg/dL (ref 6–20)
CHLORIDE: 102 mmol/L (ref 101–111)
CO2: 20 mmol/L — ABNORMAL LOW (ref 22–32)
CREATININE: 0.95 mg/dL (ref 0.61–1.24)
Calcium: 8.8 mg/dL — ABNORMAL LOW (ref 8.9–10.3)
Glucose, Bld: 106 mg/dL — ABNORMAL HIGH (ref 65–99)
Potassium: 4 mmol/L (ref 3.5–5.1)
Sodium: 136 mmol/L (ref 135–145)
Total Bilirubin: 3.8 mg/dL — ABNORMAL HIGH (ref 0.3–1.2)
Total Protein: 5.6 g/dL — ABNORMAL LOW (ref 6.5–8.1)

## 2015-03-26 LAB — CBC
HCT: 37.4 % — ABNORMAL LOW (ref 39.0–52.0)
Hemoglobin: 12.8 g/dL — ABNORMAL LOW (ref 13.0–17.0)
MCH: 26.6 pg (ref 26.0–34.0)
MCHC: 34.2 g/dL (ref 30.0–36.0)
MCV: 77.8 fL — ABNORMAL LOW (ref 78.0–100.0)
PLATELETS: 295 10*3/uL (ref 150–400)
RBC: 4.81 MIL/uL (ref 4.22–5.81)
RDW: 14.9 % (ref 11.5–15.5)
WBC: 10.3 10*3/uL (ref 4.0–10.5)

## 2015-03-26 LAB — GLUCOSE, CAPILLARY
GLUCOSE-CAPILLARY: 105 mg/dL — AB (ref 65–99)
GLUCOSE-CAPILLARY: 118 mg/dL — AB (ref 65–99)
GLUCOSE-CAPILLARY: 85 mg/dL (ref 65–99)
Glucose-Capillary: 95 mg/dL (ref 65–99)

## 2015-03-26 LAB — MAGNESIUM: MAGNESIUM: 2 mg/dL (ref 1.7–2.4)

## 2015-03-26 MED ORDER — HYDROCODONE-ACETAMINOPHEN 5-325 MG PO TABS: 1.0000 | ORAL_TABLET | Freq: Four times a day (QID) | ORAL | Status: AC | PRN

## 2015-03-26 MED ORDER — FUROSEMIDE 10 MG/ML IJ SOLN
20.0000 mg | Freq: Once | INTRAMUSCULAR | Status: AC
  Administered 2015-03-26: 20 mg via INTRAVENOUS
  Filled 2015-03-26: qty 2

## 2015-03-26 MED ORDER — DOCUSATE SODIUM 100 MG PO CAPS: 200.0000 mg | ORAL_CAPSULE | Freq: Two times a day (BID) | ORAL | Status: AC | PRN

## 2015-03-26 MED ORDER — OXYCODONE-ACETAMINOPHEN 5-325 MG PO TABS: 1.0000 | ORAL_TABLET | ORAL | Status: DC | PRN

## 2015-03-26 MED ORDER — CARVEDILOL 6.25 MG PO TABS: 6.2500 mg | ORAL_TABLET | Freq: Two times a day (BID) | ORAL | Status: AC

## 2015-03-26 NOTE — Progress Notes (Signed)
Patient ID: Tommy Payne, male   DOB: 05-05-1965, 49 y.o.   MRN: [id number]     [facility]      [address] [place], [place] [address]    Phone: [phone] FAX: [phone]     Subjective: Voiding.  No flatus. No n/v. Tolerating fulls. WBC normalized. Awaiting CMP. Abdomen sore, better now that he's ambulated.  Objective:  Vital signs:  Filed Vitals:   03/25/15 1415 03/25/15 1458 03/25/15 2126 03/26/15 0641  BP: 140/95 143/79 116/62 135/75  Pulse: 70 71 69 76  Temp: 98 F (36.7 C) 97.5 F (36.4 C) 98 F (36.7 C)   TempSrc:  Oral Oral   Resp: 17 18 18 18   Height:      Weight:      SpO2: 100% 98% 95% 94%    Last BM Date: 03/24/15  Intake/Output   Yesterday:  12/29 0701 - 12/30 0700 In: 2220 [P.O.:220; I.V.:1950; IV Piggyback:50] Out: 900 [Urine:850; Blood:50] This shift: I/O last 3 completed shifts: In: 2320 [P.O.:320; I.V.:1950; IV Piggyback:50] Out: 1900 [Urine:1850; Blood:50]      Physical Exam: General: Pt awake/alert/oriented x4 in no acute distress  Abdomen: +BS, abdomen is soft, distended.  Appropriately tender.  Incisions are c/d/i.     Problem List:   Principal Problem:   Pancreatitis, acute Active Problems:   Choledocholithiasis   Elevated blood pressure    Results:   Labs: Results for orders placed or performed during the hospital encounter of 03/21/15 (from the past 48 hour(s))  Glucose, capillary     Status: Abnormal   Collection Time: 03/24/15 12:01 PM  Result Value Ref Range   Glucose-Capillary 107 (H) 65 - 99 mg/dL  Glucose, capillary     Status: Abnormal   Collection Time: 03/24/15  6:10 PM  Result Value Ref Range   Glucose-Capillary 112 (H) 65 - 99 mg/dL  Surgical pcr screen     Status: None   Collection Time: 03/24/15  8:34 PM  Result Value Ref Range   MRSA, PCR NEGATIVE NEGATIVE   Staphylococcus aureus NEGATIVE NEGATIVE    Comment:        The Xpert SA Assay  (FDA approved for NASAL specimens in patients over 33 years of age), is one component of a comprehensive surveillance program.  Test performance has been validated by [facility] for patients greater than or equal to 44 year old. It is not intended to diagnose infection nor to guide or monitor treatment.   Glucose, capillary     Status: Abnormal   Collection Time: 03/25/15 12:03 AM  Result Value Ref Range   Glucose-Capillary 101 (H) 65 - 99 mg/dL   Comment 1 Notify RN    Comment 2 Document in Chart   Comprehensive metabolic panel     Status: Abnormal   Collection Time: 03/25/15  4:53 AM  Result Value Ref Range   Sodium 136 135 - 145 mmol/L   Potassium 4.1 3.5 - 5.1 mmol/L   Chloride 100 (L) 101 - 111 mmol/L   CO2 26 22 - 32 mmol/L   Glucose, Bld 102 (H) 65 - 99 mg/dL   BUN 9 6 - 20 mg/dL   Creatinine, Ser 1.02 0.61 - 1.24 mg/dL   Calcium 8.9 8.9 - 10.3 mg/dL   Total Protein 6.8 6.5 - 8.1 g/dL   Albumin 2.7 (L) 3.5 - 5.0 g/dL   AST 25 15 - 41 U/L   ALT 53 17 -  63 U/L   Alkaline Phosphatase 173 (H) 38 - 126 U/L   Total Bilirubin 1.7 (H) 0.3 - 1.2 mg/dL   GFR calc non Af Amer >60 >60 mL/min   GFR calc Af Amer >60 >60 mL/min    Comment: (NOTE) The eGFR has been calculated using the CKD EPI equation. This calculation has not been validated in all clinical situations. eGFR's persistently <60 mL/min signify possible Chronic Kidney Disease.    Anion gap 10 5 - 15  CBC     Status: Abnormal   Collection Time: 03/25/15  4:53 AM  Result Value Ref Range   WBC 13.1 (H) 4.0 - 10.5 K/uL   RBC 4.69 4.22 - 5.81 MIL/uL   Hemoglobin 12.2 (L) 13.0 - 17.0 g/dL   HCT 37.0 (L) 39.0 - 52.0 %   MCV 78.9 78.0 - 100.0 fL   MCH 26.0 26.0 - 34.0 pg   MCHC 33.0 30.0 - 36.0 g/dL   RDW 14.8 11.5 - 15.5 %   Platelets 298 150 - 400 K/uL  Lipase, blood     Status: None   Collection Time: 03/25/15  4:53 AM  Result Value Ref Range   Lipase 43 11 - 51 U/L  Glucose, capillary     Status: None    Collection Time: 03/25/15  5:35 AM  Result Value Ref Range   Glucose-Capillary 92 65 - 99 mg/dL   Comment 1 Notify RN    Comment 2 Document in Chart   Glucose, capillary     Status: None   Collection Time: 03/25/15  6:02 PM  Result Value Ref Range   Glucose-Capillary 97 65 - 99 mg/dL  Glucose, capillary     Status: Abnormal   Collection Time: 03/26/15 12:19 AM  Result Value Ref Range   Glucose-Capillary 118 (H) 65 - 99 mg/dL   Comment 1 Notify RN   Glucose, capillary     Status: None   Collection Time: 03/26/15  6:56 AM  Result Value Ref Range   Glucose-Capillary 95 65 - 99 mg/dL  CBC     Status: Abnormal   Collection Time: 03/26/15  8:20 AM  Result Value Ref Range   WBC 10.3 4.0 - 10.5 K/uL   RBC 4.81 4.22 - 5.81 MIL/uL   Hemoglobin 12.8 (L) 13.0 - 17.0 g/dL   HCT 37.4 (L) 39.0 - 52.0 %   MCV 77.8 (L) 78.0 - 100.0 fL   MCH 26.6 26.0 - 34.0 pg   MCHC 34.2 30.0 - 36.0 g/dL   RDW 14.9 11.5 - 15.5 %   Platelets 295 150 - 400 K/uL    Imaging / Studies: No results found.  Medications / Allergies:  Scheduled Meds: . bisacodyl  10 mg Oral Daily  . cefTRIAXone (ROCEPHIN)  IV  2 g Intravenous Q24H  . enoxaparin (LOVENOX) injection  0.5 mg/kg Subcutaneous Q24H   Continuous Infusions: . lactated ringers 50 mL/hr at 03/25/15 1024   PRN Meds:.acetaminophen **OR** [DISCONTINUED] acetaminophen, hydrALAZINE, HYDROmorphone (DILAUDID) injection, metoprolol, ondansetron **OR** ondansetron (ZOFRAN) IV, oxyCODONE-acetaminophen, sodium chloride  Antibiotics: Anti-infectives    Start     Dose/Rate Route Frequency Ordered Stop   03/26/15 1130  cefTRIAXone (ROCEPHIN) 2 g in dextrose 5 % 50 mL IVPB    Comments:  Pharmacy may adjust dosing strength / duration / interval for maximal efficacy   2 g 100 mL/hr over 30 Minutes Intravenous Every 24 hours 03/25/15 1307 03/27/15 1129   03/25/15 0915  cefOXitin (MEFOXIN) 2 g  in dextrose 5 % 50 mL IVPB  Status:  Discontinued    Comments:  Send to or  with patient   2 g 100 mL/hr over 30 Minutes Intravenous  Once 03/25/15 0913 03/25/15 0913   03/25/15 0600  cefTRIAXone (ROCEPHIN) 2 g in dextrose 5 % 50 mL IVPB    Comments:  Pharmacy may adjust dosing strength, interval, or rate of medication as needed for optimal therapy for the patient Send with patient on call to the OR.  Anesthesia to complete antibiotic administration <53mn [facility]   2 g 100 mL/hr over 30 Minutes Intravenous To ShortStay Surgical 03/24/15 0813 03/25/15 1122   03/21/15 2215  piperacillin-tazobactam (ZOSYN) IVPB 3.375 g  Status:  Discontinued     3.375 g 12.5 mL/hr over 240 Minutes Intravenous 3 times per day 03/21/15 2206 03/23/15 1057        Assessment/Plan Gallstone pancreatitis  POD#1 laparoscopic cholecystectomy---Dr. WDonne Hazel-await CMP.  If trending down, possible DC later today -mobilize, IS VTE prophylaxis-SCD/lovenox FEN-encourage PO pain meds  EErby [facility] Pager [phone](7A-4:30P) For consults and floor pages call [phone](7A-4:30P)  03/26/2015 9:25 AM

## 2015-03-26 NOTE — Care Management Note (Signed)
Case Management Note  Patient Details  Name: Tommy Payne MRN: [id number] Date of Birth: 1965-05-07  Subjective/Objective:                Spoke with patient and wife at the bedside previously. Patient from home, lives with wife and daughter, is independent. Seeks MD through urgent care. Would like to continue this after discharge. Denies any CM resources offered.    Action/Plan:  Post op choley, will DC to home self care today.  Expected Discharge Date:                  Expected Discharge Plan:  Home/Self Care  In-House Referral:     Discharge planning Services  CM Consult  Post Acute Care Choice:    Choice offered to:     DME Arranged:    DME Agency:     HH Arranged:    HH Agency:     Status of Service:  Completed, signed off  Medicare Important Message Given:    Date Medicare IM Given:    Medicare IM give by:    Date Additional Medicare IM Given:    Additional Medicare Important Message give by:     If discussed at Long Length of Stay Meetings, dates discussed:    Additional Comments:  Lawerance SabalDebbie Nikolos Billig, RN 03/26/2015, 10:43 AM

## 2015-03-26 NOTE — Progress Notes (Signed)
Patient Demographics:    Tommy Payne, is a 49 y.o. male, DOB - 02/19/1966, ZOX:[id number]  Admit date - 03/21/2015   Admitting Physician Eduard Clos, MD  Outpatient Primary MD for the patient is No PCP Per Patient  LOS - 5   Chief Complaint  Patient presents with  . Abdominal Pain        Subjective:    Tommy Payne today has, No headache, No chest pain, +ve epigastric abdominal pain but overall much improved - No Nausea, No new weakness tingling or numbness, No Cough - SOB.     Assessment  & Plan :     1. Gallstone Acute pancreatitis with possible cholecystitis and possible CBD stone. Repeat CMP pending, does have leukocytosis which post likely is reactionary, MRCP and CT abdomen rule out any necrosis or pseudocyst will stop antibiotics, continue bowel rest with IV fluids, GI and CCS consult it. MRCP appears stable, per GI no ERCP, he is post Lap Choly on  03/25/2015 , he feels better however his liver enzymes have trended up including total bilirubin. Will monitor on clear liquids repeat CMP in the morning.   2. History of smoking. Counseled to quit.   Code Status : Full  Family Communication  : Son bedisde  Disposition Plan  : Home 3-4 days  Consults  :  GI, CCS  Procedures  :   CT scan of the abdomen and pelvis along with right upper quadrant ultrasound. Showing pancreatitis, possible cholecystitis with possible CBD dilation. Also confirming cholelithiasis.  MRCP. Confirms pancreatitis but no CBD stone, rules out active cholecystitis  DVT Prophylaxis  :  Lovenox   Lab Results  Component Value Date   PLT 295 03/26/2015    Inpatient Medications  Scheduled Meds: . bisacodyl  10 mg Oral Daily  . enoxaparin (LOVENOX) injection  0.5 mg/kg Subcutaneous Q24H   Continuous  Infusions:   PRN Meds:.acetaminophen **OR** [DISCONTINUED] acetaminophen, hydrALAZINE, metoprolol, ondansetron **OR** [DISCONTINUED] ondansetron (ZOFRAN) IV, oxyCODONE-acetaminophen, sodium chloride  Antibiotics  :     Anti-infectives    Start     Dose/Rate Route Frequency Ordered Stop   03/26/15 1130  cefTRIAXone (ROCEPHIN) 2 g in dextrose 5 % 50 mL IVPB  Status:  Discontinued    Comments:  Pharmacy may adjust dosing strength / duration / interval for maximal efficacy   2 g 100 mL/hr over 30 Minutes Intravenous Every 24 hours 03/25/15 1307 03/26/15 0925   03/25/15 0915  cefOXitin (MEFOXIN) 2 g in dextrose 5 % 50 mL IVPB  Status:  Discontinued    Comments:  Send to or with patient   2 g 100 mL/hr over 30 Minutes Intravenous  Once 03/25/15 0913 03/25/15 0913   03/25/15 0600  cefTRIAXone (ROCEPHIN) 2 g in dextrose 5 % 50 mL IVPB    Comments:  Pharmacy may adjust dosing strength, interval, or rate of medication as needed for optimal therapy for the patient Send with patient on call to the OR.  Anesthesia to complete antibiotic administration <45min prior to incision per [facility].   2 g 100 mL/hr over 30 Minutes Intravenous To ShortStay Surgical 03/24/15 0813 03/25/15 1122   03/21/15 2215  piperacillin-tazobactam (ZOSYN) IVPB 3.375 g  Status:  Discontinued  3.375 g 12.5 mL/hr over 240 Minutes Intravenous 3 times per day 03/21/15 2206 03/23/15 1057        Objective:   Filed Vitals:   03/25/15 1415 03/25/15 1458 03/25/15 2126 03/26/15 0641  BP: 140/95 143/79 116/62 135/75  Pulse: 70 71 69 76  Temp: 98 F (36.7 C) 97.5 F (36.4 C) 98 F (36.7 C)   TempSrc:  Oral Oral   Resp: 17 18 18 18   Height:      Weight:      SpO2: 100% 98% 95% 94%    Wt Readings from Last 3 Encounters:  03/21/15 112.084 kg (247 lb 1.6 oz)  06/20/14 112.492 kg (248 lb)  06/28/13 110.315 kg (243 lb 3.2 oz)     Intake/Output Summary (Last 24 hours) at 03/26/15 1111 Last data filed at 03/25/15  2335  Gross per 24 hour  Intake   2220 ml  Output    900 ml  Net   1320 ml     Physical Exam  Awake Alert, Oriented X 3, No new F.N deficits, Normal affect [place].AT,PERRAL Supple Neck,No JVD, No cervical lymphadenopathy appriciated.  Symmetrical Chest wall movement, Good air movement bilaterally, CTAB RRR,No Gallops,Rubs or new Murmurs, No Parasternal Heave +ve B.Sounds, Abd Soft, +ve epigastric and RUQ tenderness, No organomegaly appriciated, No rebound - guarding or rigidity. No Cyanosis, Clubbing or edema, No new Rash or bruise       Data Review:   Micro Results Recent Results (from the past 240 hour(s))  Surgical pcr screen     Status: None   Collection Time: 03/24/15  8:34 PM  Result Value Ref Range Status   MRSA, PCR NEGATIVE NEGATIVE Final   Staphylococcus aureus NEGATIVE NEGATIVE Final    Comment:        The Xpert SA Assay (FDA approved for NASAL specimens in patients over 47 years of age), is one component of a comprehensive surveillance program.  Test performance has been validated by [facility] for patients greater than or equal to 37 year old. It is not intended to diagnose infection nor to guide or monitor treatment.     Radiology Reports Ct Abdomen Pelvis W Contrast  03/21/2015  CLINICAL DATA:  49 year old male with lower abdominal pain and diaphoresis. No nausea or vomiting. EXAM: CT ABDOMEN AND PELVIS WITH CONTRAST TECHNIQUE: Multidetector CT imaging of the abdomen and pelvis was performed using the standard protocol following bolus administration of intravenous contrast. CONTRAST:  OMNIPAQUE IOHEXOL 300 MG/ML  SOLN COMPARISON:  Ultrasound dated 03/21/2015 FINDINGS: Minimal linear bibasilar dependent subsegmental atelectatic changes. The lung bases are otherwise clear. Top-normal cardiac size. No intra-abdominal free air or free fluid. The liver is unremarkable. There is faint haziness within the gallbladder fundus which may correspond to the stone  seen on the ultrasound. There is mild dilatation of the CBD measuring up to 12 mm in diameter with normal distal tapering at the head of the pancreas. There is haziness of the wall of the biliary tree with haziness of adjacent fat. This is likely related to extension of the inflammatory changes of the pancreas. Sign An inflammatory/infectious etiology involving the gallbladder or biliary tree is not excluded. Clinical correlation is recommended. There is mild peripancreatic haziness. This finding in combination with elevated lipase level is compatible with acute pancreatitis. No drainable fluid collection/abscess or pseudocyst identified. The spleen, and the left adrenal gland appear unremarkable. There is a 2.7 x 2.6 cm indeterminate heterogeneously enhancing right adrenal nodule. MRI  is recommended for further characterization. There is a 1.3 cm nonobstructing left renal lower pole calculus. No hydronephrosis. The right kidney appears unremarkable. The visualized ureters and urinary bladder appear unremarkable. The prostate and seminal vesicles are grossly unremarkable. There is sigmoid diverticulosis with muscular hypertrophy. No active inflammation. There is apparent diffuse thickening of the distal colon, likely related to underdistention. Colitis is less likely but not excluded. Clinical correlation is recommended. No evidence of bowel obstruction. Normal appendix. The abdominal aorta and IVC appear unremarkable. No portal venous gas identified. There is no adenopathy. There is a small fat containing umbilical hernia. The abdominal wall soft tissues appear unremarkable. The osseous structures are intact. IMPRESSION: Findings most compatible with acute pancreatitis.  No abscess. Mild haziness of the biliary trees likely extension of inflammation of the pancreas. An inflammatory/infectious involving the biliary tree as the primary pathology is less likely but not excluded. Clinical correlation is recommended.  Indeterminate right adrenal nodule. MRI is recommended further characterization. Nonobstructing left renal inferior pole calculus. Sigmoid diverticulosis. Electronically Signed   By: Elgie CollardArash  Radparvar M.D.   On: 03/21/2015 19:45   Mr 3d Recon At Scanner  03/23/2015  CLINICAL DATA:  Acute pancreatitis EXAM: MRI ABDOMEN WITHOUT AND WITH CONTRAST (INCLUDING MRCP) TECHNIQUE: Multiplanar multisequence MR imaging of the abdomen was performed both before and after the administration of intravenous contrast. Heavily T2-weighted images of the biliary and pancreatic ducts were obtained, and three-dimensional MRCP images were rendered by post processing. CONTRAST:  20mL MULTIHANCE GADOBENATE DIMEGLUMINE 529 MG/ML IV SOLN COMPARISON:  CT scan 03/21/2015 and ultrasound abdomen 03/21/2015. FINDINGS: Examination is limited by breathing motion artifact. Lower chest: Minimal dependent bibasilar atelectasis. No pleural effusion. The heart is upper limits of normal in size. No pericardial effusion. The distal esophagus is grossly normal. Hepatobiliary: No focal hepatic lesions or intrahepatic biliary dilatation. The common bile duct is mildly dilated in the porta hepatis at 7 mm. It tapers normally in the pancreatic head. No common bile duct stones are identified. The gallbladder is filled with small gallstones. No findings for acute cholecystitis. Pancreas: No mass or ductal dilatation. Mild diffuse inflammation. Normal pancreatic enhancement. No peripancreatic fluid collections. Spleen: Normal size.  No focal lesions. Adrenals/Urinary Tract: Right adrenal gland lesion as noted on the CT scan demonstrates signal loss on the out of phase T1 weighted gradient echo sequence consistent with a benign adenoma. The left adrenal gland is normal. Both kidneys are grossly normal. Stomach/Bowel: The stomach, duodenum, visualized small bowel and visualized colon are grossly normal. No inflammatory changes, mass lesions or obstructive  findings. Vascular/Lymphatic: Small scattered mesenteric and retroperitoneal lymph nodes but no mass or adenopathy. The aorta and branch vessels are normal. Other: No ascites or worrisome fluid collections. Musculoskeletal: No significant bony findings. IMPRESSION: 1. Examination is limited by motion artifact. 2. Cholelithiasis without findings for acute cholecystitis. 3. Borderline common bile duct dilatation in the porta hepatis but normal in the head of the pancreas. No definite obstructing common bile duct stones are identified. 4. Mild inflammation of the pancreatic head. No peripancreatic fluid collections or pancreatic necrosis. Electronically Signed   By: Rudie MeyerP.  Gallerani M.D.   On: 03/23/2015 08:24   [place] Abdomen Limited  03/21/2015  CLINICAL DATA:  Abdominal pain. Pancreatitis. Rule out cholecystitis. EXAM: US ABDOMEN LIMITED - RIGHT UPPER QUADRANT COMPARISON:  None. FINDINGS: Gallbladder: Bold contracted gallbladder filled with gallstones. Negative sonographic Murphy sign. Gallbladder wall not thickened 2.5 mm Common bile duct: Diameter: 7.7 mm.  Mild common  bile duct dilatation. Liver: No focal liver lesion.  Mild ductal dilatation IMPRESSION: Cholelithiasis without evidence of gallbladder wall thickening or pain over the gallbladder Mild biliary dilatation. Correlate with liver function tests. Common bile duct stone not excluded. Electronically Signed   By: Marlan Palau M.D.   On: 03/21/2015 18:39   Mr Abd W/wo Cm/mrcp  03/23/2015  CLINICAL DATA:  Acute pancreatitis EXAM: MRI ABDOMEN WITHOUT AND WITH CONTRAST (INCLUDING MRCP) TECHNIQUE: Multiplanar multisequence MR imaging of the abdomen was performed both before and after the administration of intravenous contrast. Heavily T2-weighted images of the biliary and pancreatic ducts were obtained, and three-dimensional MRCP images were rendered by post processing. CONTRAST:  20mL MULTIHANCE GADOBENATE DIMEGLUMINE 529 MG/ML IV SOLN COMPARISON:  CT scan  03/21/2015 and ultrasound abdomen 03/21/2015. FINDINGS: Examination is limited by breathing motion artifact. Lower chest: Minimal dependent bibasilar atelectasis. No pleural effusion. The heart is upper limits of normal in size. No pericardial effusion. The distal esophagus is grossly normal. Hepatobiliary: No focal hepatic lesions or intrahepatic biliary dilatation. The common bile duct is mildly dilated in the porta hepatis at 7 mm. It tapers normally in the pancreatic head. No common bile duct stones are identified. The gallbladder is filled with small gallstones. No findings for acute cholecystitis. Pancreas: No mass or ductal dilatation. Mild diffuse inflammation. Normal pancreatic enhancement. No peripancreatic fluid collections. Spleen: Normal size.  No focal lesions. Adrenals/Urinary Tract: Right adrenal gland lesion as noted on the CT scan demonstrates signal loss on the out of phase T1 weighted gradient echo sequence consistent with a benign adenoma. The left adrenal gland is normal. Both kidneys are grossly normal. Stomach/Bowel: The stomach, duodenum, visualized small bowel and visualized colon are grossly normal. No inflammatory changes, mass lesions or obstructive findings. Vascular/Lymphatic: Small scattered mesenteric and retroperitoneal lymph nodes but no mass or adenopathy. The aorta and branch vessels are normal. Other: No ascites or worrisome fluid collections. Musculoskeletal: No significant bony findings. IMPRESSION: 1. Examination is limited by motion artifact. 2. Cholelithiasis without findings for acute cholecystitis. 3. Borderline common bile duct dilatation in the porta hepatis but normal in the head of the pancreas. No definite obstructing common bile duct stones are identified. 4. Mild inflammation of the pancreatic head. No peripancreatic fluid collections or pancreatic necrosis. Electronically Signed   By: Rudie Meyer M.D.   On: 03/23/2015 08:24     CBC  Recent Labs Lab  03/22/15 0920 03/23/15 0709 03/24/15 0542 03/25/15 0453 03/26/15 0820  WBC 14.0* 23.9* 17.8* 13.1* 10.3  HGB 14.2 13.8 13.1 12.2* 12.8*  HCT 41.7 40.9 39.1 37.0* 37.4*  PLT 282 286 285 298 295  MCV 77.9* 78.4 78.2 78.9 77.8*  MCH 26.5 26.4 26.2 26.0 26.6  MCHC 34.1 33.7 33.5 33.0 34.2  RDW 15.1 14.9 14.7 14.8 14.9    Chemistries   Recent Labs Lab 03/22/15 0920 03/23/15 0709 03/24/15 0542 03/25/15 0453 03/26/15 0752  NA 138 136 135 136 136  K 4.7 3.9 3.6 4.1 4.0  CL 105 101 100* 100* 102  CO2 21* 26 25 26  20*  GLUCOSE 91 118* 107* 102* 106*  BUN 8 10 8 9 6   CREATININE 1.17 1.16 1.06 1.02 0.95  CALCIUM 9.0 9.1 9.0 8.9 8.8*  MG  --   --   --   --  2.0  AST 85* 39 24 25 119*  ALT 165* 117* 73* 53 126*  ALKPHOS 290* 258* 201* 173* 365*  BILITOT 6.2* 2.9* 2.1* 1.7* 3.8*   ------------------------------------------------------------------------------------------------------------------  estimated creatinine clearance is 114.3 mL/min (by C-G formula based on Cr of 0.95). ------------------------------------------------------------------------------------------------------------------ No results for input(s): HGBA1C in the last 72 hours. ------------------------------------------------------------------------------------------------------------------ No results for input(s): CHOL, HDL, LDLCALC, TRIG, CHOLHDL, LDLDIRECT in the last 72 hours. ------------------------------------------------------------------------------------------------------------------ No results for input(s): TSH, T4TOTAL, T3FREE, THYROIDAB in the last 72 hours.  Invalid input(s): FREET3 ------------------------------------------------------------------------------------------------------------------ No results for input(s): VITAMINB12, FOLATE, FERRITIN, TIBC, IRON, RETICCTPCT in the last 72 hours.  Coagulation profile  Recent Labs Lab 03/22/15 0920  INR 1.00    No results for input(s): DDIMER in  the last 72 hours.  Cardiac Enzymes No results for input(s): CKMB, TROPONINI, MYOGLOBIN in the last 168 hours.  Invalid input(s): CK ------------------------------------------------------------------------------------------------------------------ Invalid input(s): POCBNP   Time Spent in minutes   35   Corryn Madewell K M.D on 03/26/2015 at 11:11 AM  Between 7am to 7pm - Pager - [phone]  After 7pm go to [email or web address] - password [facility]  [facility] -  Office  [phone]

## 2015-03-26 NOTE — Discharge Summary (Signed)
Tommy Payne, is a 49 y.o. male  DOB 05-26-1965  MRN [id number].  Admission date:  03/21/2015  Admitting Physician  Eduard ClosArshad N Kakrakandy, MD  Discharge Date:  03/27/2015   Primary MD  No PCP Per Patient  Recommendations for primary care physician for things to follow:   Check CMP, CBC in 3-4 days, Outpt GI and CCS follow up.   Admission Diagnosis  Choledocholithiasis [K80.50] Acute pancreatitis, unspecified pancreatitis type [K85.9]   Discharge Diagnosis  Choledocholithiasis [K80.50] Acute pancreatitis, unspecified pancreatitis type [K85.9]     Principal Problem:   Pancreatitis, acute Active Problems:   Choledocholithiasis   Elevated blood pressure      Past Medical History  Diagnosis Date  . Acute pancreatitis 03/21/2015    Past Surgical History  Procedure Laterality Date  . No past surgeries    . Cholecystectomy N/A 03/25/2015    Procedure: LAPAROSCOPIC CHOLECYSTECTOMY ;  Surgeon: Emelia LoronMatthew Wakefield, MD;  Location: [facility] OR;  Service: General;  Laterality: N/A;       HPI  from the history and physical done on the day of admission:    Tommy Payne is a 49 y.o. male with previous history of hypertension presently on no medications presents to the ER because of persistent abdominal pain. Patient has been a abdominal pain over the last 3-4 days. Pain is mostly in the epigastric area with nausea denies any vomiting. Patient has had a bowel movement this morning which was normal as per the patient. Patient's pain increases on eating. In the ER labs reveal a markedly elevated lipase and elevated LFTs. Sonogram of the abdomen shows gallbladder stones with CBD dilatation. CT abdomen and pelvis shows features consistent with acute pancreatitis with possible inflammatory changes in the biliary tree. On-call  gastroenterologist Dr. Randa EvensEdwards was consulted by ER physician and patient has been admitted for acute pancreatitis with possible cholangitis. Denies any chest pain or shortness of breath.      Hospital Course:     1. Gallstone Acute pancreatitis with possible cholecystitis and possible passed CBD stone.  MRCP and CT abdomen ruled out any necrosis or pseudocyst was treated with bowel rest & IV fluids, GI and CCS consulted. MRCP appeared stable, per GI no ERCP, he is post Lap Choly on 03/25/2015 , he feels better however his liver enzymes had trended up including total bilirubin day 1 post op and then improved day 2, Today symptom free cleared by CCS for DC, will follow with CCS and GI post DC.     2. History of smoking. Counseled to quit.       Discharge Condition: Stable  Follow UP  Follow-up Information    Follow up with [facility] On 04/14/2015.   Specialty:  General Surgery   Why:  Your appointment is at 11:30 AM, be at the office 30 minutes early for check in.   Contact information:   [address] [place] [address] [phone]       Follow up with Your PCP  at urgent care. Schedule an appointment as soon as possible for a visit in 1 week.   Why:  Get CBC, CMP and a 2 view chest x-ray checked within 5-7 days      Follow up with Freddy Jaksch, MD. Schedule an appointment as soon as possible for a visit in 1 week.   Specialty:  Gastroenterology   Why:  [facility]   Contact information:   [address] [place] [address] [phone]        Consults obtained - GI, CCS  Diet and Activity recommendation: See Discharge Instructions below  Discharge Instructions           Discharge Instructions    Discharge instructions    Complete by:  As directed   Follow with Primary MD in 7 days   Get CBC, CMP, 2 view Chest X ray checked  by Primary MD in 1 week.   Activity: As tolerated with Full fall precautions use walker/cane &  assistance as needed   Disposition Home    Diet:   Soft diet, advance as tolerated to regular consistency heart healthy diet for the next 3-4 days.    For Heart failure patients - Check your Weight same time everyday, if you gain over 2 pounds, or you develop in leg swelling, experience more shortness of breath or chest pain, call your Primary MD immediately. Follow Cardiac Low Salt Diet and 1.5 lit/day fluid restriction.   On your next visit with your primary care physician please Get Medicines reviewed and adjusted.   Please request your Prim.MD to go over all Hospital Tests and Procedure/Radiological results at the follow up, please get all Hospital records sent to your Prim MD by signing hospital release before you go home.   If you experience worsening of your admission symptoms, develop shortness of breath, life threatening emergency, suicidal or homicidal thoughts you must seek medical attention immediately by calling 911 or calling your MD immediately  if symptoms less severe.  You Must read complete instructions/literature along with all the possible adverse reactions/side effects for all the Medicines you take and that have been prescribed to you. Take any new Medicines after you have completely understood and accpet all the possible adverse reactions/side effects.   Do not drive, operating heavy machinery, perform activities at heights, swimming or participation in water activities or provide baby sitting services if your were admitted for syncope or siezures until you have seen by Primary MD or a Neurologist and advised to do so again.  Do not drive when taking Pain medications.    Do not take more than prescribed Pain, Sleep and Anxiety Medications  Special Instructions: If you have smoked or chewed Tobacco  in the last 2 yrs please stop smoking, stop any regular Alcohol  and or any Recreational drug use.  Wear Seat belts while driving.   Please note  You were cared  for by a hospitalist during your hospital stay. If you have any questions about your discharge medications or the care you received while you were in the hospital after you are discharged, you can call the unit and asked to speak with the hospitalist on call if the hospitalist that took care of you is not available. Once you are discharged, your primary care physician will handle any further medical issues. Please note that NO REFILLS for any discharge medications will be authorized once you are discharged, as it is imperative that you return to your primary care  physician (or establish a relationship with a primary care physician if you do not have one) for your aftercare needs so that they can reassess your need for medications and monitor your lab values.     Discharge patient    Complete by:  As directed      Increase activity slowly    Complete by:  As directed              Discharge Medications       Medication List    TAKE these medications        acetaminophen 500 MG tablet  Commonly known as:  TYLENOL  Take 1,000 mg by mouth every 6 (six) hours as needed for mild pain or headache.     bismuth subsalicylate 262 MG/15ML suspension  Commonly known as:  PEPTO BISMOL  Take 30 mLs by mouth every 6 (six) hours as needed for indigestion or diarrhea or loose stools.     carvedilol 6.25 MG tablet  Commonly known as:  COREG  Take 1 tablet (6.25 mg total) by mouth 2 (two) times daily with a meal.     docusate sodium 100 MG capsule  Commonly known as:  COLACE  Take 2 capsules (200 mg total) by mouth 2 (two) times daily as needed for mild constipation.     HYDROcodone-acetaminophen 5-325 MG tablet  Commonly known as:  NORCO/VICODIN  Take 1 tablet by mouth every 6 (six) hours as needed for moderate pain.     magnesium hydroxide 400 MG/5ML suspension  Commonly known as:  MILK OF MAGNESIA  Take 30 mLs by mouth daily as needed for mild constipation.        Major procedures and  Radiology Reports - PLEASE review detailed and final reports for all details, in brief -    Lap Choly 03-25-15   Ct Abdomen Pelvis W Contrast  03/21/2015  CLINICAL DATA:  49 year old male with lower abdominal pain and diaphoresis. No nausea or vomiting. EXAM: CT ABDOMEN AND PELVIS WITH CONTRAST TECHNIQUE: Multidetector CT imaging of the abdomen and pelvis was performed using the standard protocol following bolus administration of intravenous contrast. CONTRAST:  OMNIPAQUE IOHEXOL 300 MG/ML  SOLN COMPARISON:  Ultrasound dated 03/21/2015 FINDINGS: Minimal linear bibasilar dependent subsegmental atelectatic changes. The lung bases are otherwise clear. Top-normal cardiac size. No intra-abdominal free air or free fluid. The liver is unremarkable. There is faint haziness within the gallbladder fundus which may correspond to the stone seen on the ultrasound. There is mild dilatation of the CBD measuring up to 12 mm in diameter with normal distal tapering at the head of the pancreas. There is haziness of the wall of the biliary tree with haziness of adjacent fat. This is likely related to extension of the inflammatory changes of the pancreas. Sign An inflammatory/infectious etiology involving the gallbladder or biliary tree is not excluded. Clinical correlation is recommended. There is mild peripancreatic haziness. This finding in combination with elevated lipase level is compatible with acute pancreatitis. No drainable fluid collection/abscess or pseudocyst identified. The spleen, and the left adrenal gland appear unremarkable. There is a 2.7 x 2.6 cm indeterminate heterogeneously enhancing right adrenal nodule. MRI is recommended for further characterization. There is a 1.3 cm nonobstructing left renal lower pole calculus. No hydronephrosis. The right kidney appears unremarkable. The visualized ureters and urinary bladder appear unremarkable. The prostate and seminal vesicles are grossly unremarkable. There  is sigmoid diverticulosis with muscular hypertrophy. No active inflammation. There is apparent diffuse thickening  of the distal colon, likely related to underdistention. Colitis is less likely but not excluded. Clinical correlation is recommended. No evidence of bowel obstruction. Normal appendix. The abdominal aorta and IVC appear unremarkable. No portal venous gas identified. There is no adenopathy. There is a small fat containing umbilical hernia. The abdominal wall soft tissues appear unremarkable. The osseous structures are intact. IMPRESSION: Findings most compatible with acute pancreatitis.  No abscess. Mild haziness of the biliary trees likely extension of inflammation of the pancreas. An inflammatory/infectious involving the biliary tree as the primary pathology is less likely but not excluded. Clinical correlation is recommended. Indeterminate right adrenal nodule. MRI is recommended further characterization. Nonobstructing left renal inferior pole calculus. Sigmoid diverticulosis. Electronically Signed   By: Elgie Collard M.D.   On: 03/21/2015 19:45   Mr 3d Recon At Scanner  03/23/2015  CLINICAL DATA:  Acute pancreatitis EXAM: MRI ABDOMEN WITHOUT AND WITH CONTRAST (INCLUDING MRCP) TECHNIQUE: Multiplanar multisequence MR imaging of the abdomen was performed both before and after the administration of intravenous contrast. Heavily T2-weighted images of the biliary and pancreatic ducts were obtained, and three-dimensional MRCP images were rendered by post processing. CONTRAST:  20mL MULTIHANCE GADOBENATE DIMEGLUMINE 529 MG/ML IV SOLN COMPARISON:  CT scan 03/21/2015 and ultrasound abdomen 03/21/2015. FINDINGS: Examination is limited by breathing motion artifact. Lower chest: Minimal dependent bibasilar atelectasis. No pleural effusion. The heart is upper limits of normal in size. No pericardial effusion. The distal esophagus is grossly normal. Hepatobiliary: No focal hepatic lesions or intrahepatic  biliary dilatation. The common bile duct is mildly dilated in the porta hepatis at 7 mm. It tapers normally in the pancreatic head. No common bile duct stones are identified. The gallbladder is filled with small gallstones. No findings for acute cholecystitis. Pancreas: No mass or ductal dilatation. Mild diffuse inflammation. Normal pancreatic enhancement. No peripancreatic fluid collections. Spleen: Normal size.  No focal lesions. Adrenals/Urinary Tract: Right adrenal gland lesion as noted on the CT scan demonstrates signal loss on the out of phase T1 weighted gradient echo sequence consistent with a benign adenoma. The left adrenal gland is normal. Both kidneys are grossly normal. Stomach/Bowel: The stomach, duodenum, visualized small bowel and visualized colon are grossly normal. No inflammatory changes, mass lesions or obstructive findings. Vascular/Lymphatic: Small scattered mesenteric and retroperitoneal lymph nodes but no mass or adenopathy. The aorta and branch vessels are normal. Other: No ascites or worrisome fluid collections. Musculoskeletal: No significant bony findings. IMPRESSION: 1. Examination is limited by motion artifact. 2. Cholelithiasis without findings for acute cholecystitis. 3. Borderline common bile duct dilatation in the porta hepatis but normal in the head of the pancreas. No definite obstructing common bile duct stones are identified. 4. Mild inflammation of the pancreatic head. No peripancreatic fluid collections or pancreatic necrosis. Electronically Signed   By: Rudie Meyer M.D.   On: 03/23/2015 08:24   US Abdomen Limited  03/21/2015  CLINICAL DATA:  Abdominal pain. Pancreatitis. Rule out cholecystitis. EXAM: US ABDOMEN LIMITED - RIGHT UPPER QUADRANT COMPARISON:  None. FINDINGS: Gallbladder: Bold contracted gallbladder filled with gallstones. Negative sonographic Murphy sign. Gallbladder wall not thickened 2.5 mm Common bile duct: Diameter: 7.7 mm.  Mild common bile duct  dilatation. Liver: No focal liver lesion.  Mild ductal dilatation IMPRESSION: Cholelithiasis without evidence of gallbladder wall thickening or pain over the gallbladder Mild biliary dilatation. Correlate with liver function tests. Common bile duct stone not excluded. Electronically Signed   By: Marlan Palau M.D.   On: 03/21/2015 18:39  Mr Abd W/wo Cm/mrcp  03/23/2015  CLINICAL DATA:  Acute pancreatitis EXAM: MRI ABDOMEN WITHOUT AND WITH CONTRAST (INCLUDING MRCP) TECHNIQUE: Multiplanar multisequence MR imaging of the abdomen was performed both before and after the administration of intravenous contrast. Heavily T2-weighted images of the biliary and pancreatic ducts were obtained, and three-dimensional MRCP images were rendered by post processing. CONTRAST:  20mL MULTIHANCE GADOBENATE DIMEGLUMINE 529 MG/ML IV SOLN COMPARISON:  CT scan 03/21/2015 and ultrasound abdomen 03/21/2015. FINDINGS: Examination is limited by breathing motion artifact. Lower chest: Minimal dependent bibasilar atelectasis. No pleural effusion. The heart is upper limits of normal in size. No pericardial effusion. The distal esophagus is grossly normal. Hepatobiliary: No focal hepatic lesions or intrahepatic biliary dilatation. The common bile duct is mildly dilated in the porta hepatis at 7 mm. It tapers normally in the pancreatic head. No common bile duct stones are identified. The gallbladder is filled with small gallstones. No findings for acute cholecystitis. Pancreas: No mass or ductal dilatation. Mild diffuse inflammation. Normal pancreatic enhancement. No peripancreatic fluid collections. Spleen: Normal size.  No focal lesions. Adrenals/Urinary Tract: Right adrenal gland lesion as noted on the CT scan demonstrates signal loss on the out of phase T1 weighted gradient echo sequence consistent with a benign adenoma. The left adrenal gland is normal. Both kidneys are grossly normal. Stomach/Bowel: The stomach, duodenum, visualized small  bowel and visualized colon are grossly normal. No inflammatory changes, mass lesions or obstructive findings. Vascular/Lymphatic: Small scattered mesenteric and retroperitoneal lymph nodes but no mass or adenopathy. The aorta and branch vessels are normal. Other: No ascites or worrisome fluid collections. Musculoskeletal: No significant bony findings. IMPRESSION: 1. Examination is limited by motion artifact. 2. Cholelithiasis without findings for acute cholecystitis. 3. Borderline common bile duct dilatation in the porta hepatis but normal in the head of the pancreas. No definite obstructing common bile duct stones are identified. 4. Mild inflammation of the pancreatic head. No peripancreatic fluid collections or pancreatic necrosis. Electronically Signed   By: Rudie Meyer M.D.   On: 03/23/2015 08:24    Micro Results     Recent Results (from the past 240 hour(s))  Surgical pcr screen     Status: None   Collection Time: 03/24/15  8:34 PM  Result Value Ref Range Status   MRSA, PCR NEGATIVE NEGATIVE Final   Staphylococcus aureus NEGATIVE NEGATIVE Final    Comment:        The Xpert SA Assay (FDA approved for NASAL specimens in patients over 69 years of age), is one component of a comprehensive surveillance program.  Test performance has been validated by [facility] for patients greater than or equal to 50 year old. It is not intended to diagnose infection nor to guide or monitor treatment.        Today   Subjective    Tommy Payne today has no headache,no chest abdominal pain,no new weakness tingling or numbness, feels much better wants to go home today.     Objective   Blood pressure 145/79, pulse 86, temperature 98.4 F (36.9 C), temperature source Oral, resp. rate 18, height 5\' 8"  (1.727 m), weight 112.084 kg (247 lb 1.6 oz), SpO2 96 %.   Intake/Output Summary (Last 24 hours) at 03/27/15 0852 Last data filed at 03/27/15 0154  Gross per 24 hour  Intake     20 ml    Output      0 ml  Net     20 ml    Exam Awake Alert,  Oriented x 3, No new F.N deficits, Normal affect .AT,PERRAL Supple Neck,No JVD, No cervical lymphadenopathy appriciated.  Symmetrical Chest wall movement, Good air movement bilaterally, CTAB RRR,No Gallops,Rubs or new Murmurs, No Parasternal Heave +ve B.Sounds, Abd Soft, Non tender, No organomegaly appriciated, No rebound -guarding or rigidity. No Cyanosis, Clubbing or edema, No new Rash or bruise   Data Review   CBC w Diff:  Lab Results  Component Value Date   WBC 10.3 03/26/2015   HGB 12.8* 03/26/2015   HCT 37.4* 03/26/2015   PLT 295 03/26/2015    CMP:  Lab Results  Component Value Date   NA 138 03/27/2015   K 3.6 03/27/2015   CL 101 03/27/2015   CO2 26 03/27/2015   BUN 6 03/27/2015   CREATININE 1.03 03/27/2015   PROT 6.2* 03/27/2015   ALBUMIN 2.7* 03/27/2015   BILITOT 1.7* 03/27/2015   ALKPHOS 311* 03/27/2015   AST 54* 03/27/2015   ALT 94* 03/27/2015  .   Total Time in preparing paper work, data evaluation and todays exam - 35 minutes  Leroy Sea M.D on 03/27/2015 at 8:52 AM  [facility]   Office  [phone]

## 2015-03-26 NOTE — Discharge Instructions (Signed)
Follow with Primary MD in 7 days   Get CBC, CMP, 2 view Chest X ray checked  by Primary MD in 1 week.   Activity: As tolerated with Full fall precautions use walker/cane & assistance as needed   Disposition Home    Diet:   Soft diet, advance as tolerated to regular consistency heart healthy diet for the next 3-4 days.    For Heart failure patients - Check your Weight same time everyday, if you gain over 2 pounds, or you develop in leg swelling, experience more shortness of breath or chest pain, call your Primary MD immediately. Follow Cardiac Low Salt Diet and 1.5 lit/day fluid restriction.   On your next visit with your primary care physician please Get Medicines reviewed and adjusted.   Please request your Prim.MD to go over all Hospital Tests and Procedure/Radiological results at the follow up, please get all Hospital records sent to your Prim MD by signing hospital release before you go home.   If you experience worsening of your admission symptoms, develop shortness of breath, life threatening emergency, suicidal or homicidal thoughts you must seek medical attention immediately by calling 911 or calling your MD immediately  if symptoms less severe.  You Must read complete instructions/literature along with all the possible adverse reactions/side effects for all the Medicines you take and that have been prescribed to you. Take any new Medicines after you have completely understood and accpet all the possible adverse reactions/side effects.   Do not drive, operating heavy machinery, perform activities at heights, swimming or participation in water activities or provide baby sitting services if your were admitted for syncope or siezures until you have seen by Primary MD or a Neurologist and advised to do so again.  Do not drive when taking Pain medications.    Do not take more than prescribed Pain, Sleep and Anxiety Medications  Special Instructions: If you have smoked or chewed  Tobacco  in the last 2 yrs please stop smoking, stop any regular Alcohol  and or any Recreational drug use.  Wear Seat belts while driving.   Please note  You were cared for by a hospitalist during your hospital stay. If you have any questions about your discharge medications or the care you received while you were in the hospital after you are discharged, you can call the unit and asked to speak with the hospitalist on call if the hospitalist that took care of you is not available. Once you are discharged, your primary care physician will handle any further medical issues. Please note that NO REFILLS for any discharge medications will be authorized once you are discharged, as it is imperative that you return to your primary care physician (or establish a relationship with a primary care physician if you do not have one) for your aftercare needs so that they can reassess your need for medications and monitor your lab values.        [facility] ______[facility] LAPAROSCOPIC SURGERY: POST OP INSTRUCTIONS Always review your discharge instruction sheet given to you by the facility where your surgery was performed. IF YOU HAVE DISABILITY OR FAMILY LEAVE FORMS, YOU MUST BRING THEM TO THE OFFICE FOR PROCESSING.   DO NOT GIVE THEM TO YOUR DOCTOR.  1. A prescription for pain medication may be given to you upon discharge.  Take your pain medication as prescribed, if needed.  If narcotic pain medicine is not needed, then you may take acetaminophen (Tylenol) or ibuprofen (Advil) as  needed. 2. Take your usually prescribed medications unless otherwise directed. 3. If you need a refill on your pain medication, please contact your pharmacy.  They will contact our office to request authorization. Prescriptions will not be filled after 5pm or on week-ends. 4. You should follow a light diet the first few days after arrival home, such as soup and crackers, etc.  Be sure to include lots of fluids  daily. 5. Most patients will experience some swelling and bruising in the area of the incisions.  Ice packs will help.  Swelling and bruising can take several days to resolve.  6. It is common to experience some constipation if taking pain medication after surgery.  Increasing fluid intake and taking a stool softener (such as Colace) will usually help or prevent this problem from occurring.  A mild laxative (Milk of Magnesia or Miralax) should be taken according to package instructions if there are no bowel movements after 48 hours. 7. Unless discharge instructions indicate otherwise, you may remove your bandages 24-48 hours after surgery, and you may shower at that time.  You may have steri-strips (small skin tapes) in place directly over the incision.  These strips should be left on the skin for 7-10 days.  If your surgeon used skin glue on the incision, you may shower in 24 hours.  The glue will flake off over the next 2-3 weeks.  Any sutures or staples will be removed at the office during your follow-up visit. 8. ACTIVITIES:  You may resume regular (light) daily activities beginning the next day--such as daily self-care, walking, climbing stairs--gradually increasing activities as tolerated.  You may have sexual intercourse when it is comfortable.  Refrain from any heavy lifting or straining until approved by your doctor. a. You may drive when you are no longer taking prescription pain medication, you can comfortably wear a seatbelt, and you can safely maneuver your car and apply brakes. b. RETURN TO WORK:  __________________________________________________________ 9. You should see your doctor in the office for a follow-up appointment approximately 2-3 weeks after your surgery.  Make sure that you call for this appointment within a day or two after you arrive home to insure a convenient appointment time. 10. OTHER INSTRUCTIONS:  __________________________________________________________________________________________________________________________ __________________________________________________________________________________________________________________________ WHEN TO CALL YOUR DOCTOR: 1. Fever over 101.0 2. Inability to urinate 3. Continued bleeding from incision. 4. Increased pain, redness, or drainage from the incision. 5. Increasing abdominal pain  The clinic staff is available to answer your questions during regular business hours.  Please dont hesitate to call and ask to speak to one of the nurses for clinical concerns.  If you have a medical emergency, go to the nearest emergency room or call 911.  A surgeon from [facility] is always on call at the hospital. [address], [place], [place]  [address] ? [address] [address], [place], [place]   [address] [phone] ? [phone] ? FAX [phone] Web site: [email or web address]  Laparoscopic Cholecystectomy, Care After Refer to this sheet in the next few weeks. These instructions provide you with information about caring for yourself after your procedure. Your health care provider may also give you more specific instructions. Your treatment has been planned according to current medical practices, but problems sometimes occur. Call your health care provider if you have any problems or questions after your procedure. WHAT TO EXPECT AFTER THE PROCEDURE After your procedure, it is common to have:  Pain at your incision sites. You will be given pain  medicines to control your pain.  Mild nausea or vomiting. This should improve after the first 24 hours.  Bloating and possible shoulder pain from the gas that was used during the procedure. This will improve after the first 24 hours. HOME CARE INSTRUCTIONS Incision Care  Follow instructions from your health care provider about how to take care of your incisions. Make sure  you:  Wash your hands with soap and water before you change your bandage (dressing). If soap and water are not available, use hand sanitizer.  Change your dressing as told by your health care provider.  Leave stitches (sutures), skin glue, or adhesive strips in place. These skin closures may need to be in place for 2 weeks or longer. If adhesive strip edges start to loosen and curl up, you may trim the loose edges. Do not remove adhesive strips completely unless your health care provider tells you to do that.  Do not take baths, swim, or use a hot tub until your health care provider approves. Ask your health care provider if you can take showers. You may only be allowed to take sponge baths for bathing. General Instructions  Take over-the-counter and prescription medicines only as told by your health care provider.  Do not drive or operate heavy machinery while taking prescription pain medicine.  Return to your normal diet as told by your health care provider.  Do not lift anything that is heavier than 10 lb (4.5 kg).  Do not play contact sports for one week or until your health care provider approves. SEEK MEDICAL CARE IF:   You have redness, swelling, or pain at the site of your incision.  You have fluid, blood, or pus coming from your incision.  You notice a bad smell coming from your incision area.  Your surgical incisions break open.  You have a fever. SEEK IMMEDIATE MEDICAL CARE IF:  You develop a rash.  You have difficulty breathing.  You have chest pain.  You have increasing pain in your shoulders (shoulder strap areas).  You faint or have dizzy episodes while you are standing.  You have severe pain in your abdomen.  You have nausea or vomiting that lasts for more than one day.   This information is not intended to replace advice given to you by your health care provider. Make sure you discuss any questions you have with your health care provider.   Document  Released: 03/13/2005 Document Revised: 12/02/2014 Document Reviewed: 10/23/2012 Elsevier Interactive Patient Education [employer]

## 2015-03-27 LAB — CBC
HCT: 38 % — ABNORMAL LOW (ref 39.0–52.0)
Hemoglobin: 12.4 g/dL — ABNORMAL LOW (ref 13.0–17.0)
MCH: 25.7 pg — AB (ref 26.0–34.0)
MCHC: 32.6 g/dL (ref 30.0–36.0)
MCV: 78.7 fL (ref 78.0–100.0)
PLATELETS: 373 10*3/uL (ref 150–400)
RBC: 4.83 MIL/uL (ref 4.22–5.81)
RDW: 14.8 % (ref 11.5–15.5)
WBC: 8.8 10*3/uL (ref 4.0–10.5)

## 2015-03-27 LAB — COMPREHENSIVE METABOLIC PANEL
ALBUMIN: 2.7 g/dL — AB (ref 3.5–5.0)
ALK PHOS: 311 U/L — AB (ref 38–126)
ALT: 94 U/L — AB (ref 17–63)
AST: 54 U/L — AB (ref 15–41)
Anion gap: 11 (ref 5–15)
BUN: 6 mg/dL (ref 6–20)
CHLORIDE: 101 mmol/L (ref 101–111)
CO2: 26 mmol/L (ref 22–32)
CREATININE: 1.03 mg/dL (ref 0.61–1.24)
Calcium: 9.1 mg/dL (ref 8.9–10.3)
GFR calc non Af Amer: >60 mL/min (ref >60)
GLUCOSE: 103 mg/dL — AB (ref 65–99)
Potassium: 3.6 mmol/L (ref 3.5–5.1)
SODIUM: 138 mmol/L (ref 135–145)
Total Bilirubin: 1.7 mg/dL — ABNORMAL HIGH (ref 0.3–1.2)
Total Protein: 6.2 g/dL — ABNORMAL LOW (ref 6.5–8.1)

## 2015-03-27 LAB — GLUCOSE, CAPILLARY
Glucose-Capillary: 123 mg/dL — ABNORMAL HIGH (ref 65–99)
Glucose-Capillary: 103 mg/dL — ABNORMAL HIGH (ref 65–99)

## 2015-03-27 NOTE — Progress Notes (Signed)
     Tommy Payne was admitted to the Hospital on 03/21/2015 and Discharged  03/27/2015 and should be excused from work/school   for 10 days starting 03/21/2015 , may return to work/school without any restrictions.  Call Susa RaringPrashant Petrina Melby MD, [facility]  [phone] with questions.  Leroy SeaSINGH,Tovah Slavick K M.D on 03/27/2015,at 10:06 AM  [facility]   Office  [phone]

## 2015-03-27 NOTE — Progress Notes (Signed)
2 Days Post-Op  Subjective: No complaints tolerating diet  Objective: Vital signs in last 24 hours: Temp:  [98.2 F (36.8 C)-99.3 F (37.4 C)] 98.4 F (36.9 C) (12/31 16100632) Pulse Rate:  [66-86] 86 (12/31 0632) Resp:  [18-28] 18 (12/31 96040632) BP: (145-172)/(77-98) 145/79 mmHg (12/31 0632) SpO2:  [96 %-97 %] 96 % (12/31 0632) Last BM Date: 03/26/15  Intake/Output from previous day: 12/30 0701 - 12/31 0700 In: 20 [I.V.:20] Out: -  Intake/Output this shift:    GI: soft approp tender incisions clean  Lab Results:   Recent Labs  03/25/15 0453 03/26/15 0820  WBC 13.1* 10.3  HGB 12.2* 12.8*  HCT 37.0* 37.4*  PLT 298 295   BMET  Recent Labs  03/26/15 0752 03/27/15 0621  NA 136 138  K 4.0 3.6  CL 102 101  CO2 20* 26  GLUCOSE 106* 103*  BUN 6 6  CREATININE 0.95 1.03  CALCIUM 8.8* 9.1   PT/INR No results for input(s): LABPROT, INR in the last 72 hours. ABG No results for input(s): PHART, HCO3 in the last 72 hours.  Invalid input(s): PCO2, PO2  Studies/Results: No results found.  Anti-infectives: Anti-infectives    Start     Dose/Rate Route Frequency Ordered Stop   03/26/15 1130  cefTRIAXone (ROCEPHIN) 2 g in dextrose 5 % 50 mL IVPB  Status:  Discontinued    Comments:  Pharmacy may adjust dosing strength / duration / interval for maximal efficacy   2 g 100 mL/hr over 30 Minutes Intravenous Every 24 hours 03/25/15 1307 03/26/15 0925   03/25/15 0915  cefOXitin (MEFOXIN) 2 g in dextrose 5 % 50 mL IVPB  Status:  Discontinued    Comments:  Send to or with patient   2 g 100 mL/hr over 30 Minutes Intravenous  Once 03/25/15 0913 03/25/15 0913   03/25/15 0600  cefTRIAXone (ROCEPHIN) 2 g in dextrose 5 % 50 mL IVPB    Comments:  Pharmacy may adjust dosing strength, interval, or rate of medication as needed for optimal therapy for the patient Send with patient on call to the OR.  Anesthesia to complete antibiotic administration <6560min prior to incision [facility]   2 g 100 mL/hr over 30 Minutes Intravenous To [facility] 03/24/15 0813 03/25/15 1122   03/21/15 2215  piperacillin-tazobactam (ZOSYN) IVPB 3.375 g  Status:  Discontinued     3.375 g 12.5 mL/hr over 240 Minutes Intravenous 3 times per day 03/21/15 2206 03/23/15 1057      Assessment/Plan: POD 2 lap chole  lfts all better, dc home today with follow up  [facility] LLCWAKEFIELD,Berdia Lachman 03/27/2015

## 2015-03-27 NOTE — Progress Notes (Signed)
Patient discharge teaching given, including activity, diet, follow-up appoints, and medications. Patient verbalized understanding of all discharge instructions. IV access was d/c'd. Vitals are stable. Skin is intact except as charted in most recent assessments. Pt to be escorted out by NT, to be driven home by family. 

## 2016-11-03 ENCOUNTER — Emergency Department (HOSPITAL_COMMUNITY)
Admission: EM | Admit: 2016-11-03 | Discharge: 2016-11-04 | Disposition: A | Discharge status: Home / Self Care | Payer: No Typology Code available for payment source | Attending: Emergency Medicine | Admitting: Emergency Medicine

## 2016-11-03 ENCOUNTER — Emergency Department (HOSPITAL_COMMUNITY): Payer: No Typology Code available for payment source

## 2016-11-03 ENCOUNTER — Encounter (HOSPITAL_COMMUNITY): Payer: Self-pay | Admitting: Emergency Medicine

## 2016-11-03 DIAGNOSIS — Z79899 Other long term (current) drug therapy: Secondary | ICD-10-CM | POA: Insufficient documentation

## 2016-11-03 DIAGNOSIS — F17210 Nicotine dependence, cigarettes, uncomplicated: Secondary | ICD-10-CM | POA: Insufficient documentation

## 2016-11-03 DIAGNOSIS — I10 Essential (primary) hypertension: Secondary | ICD-10-CM | POA: Insufficient documentation

## 2016-11-03 DIAGNOSIS — R52 Pain, unspecified: Secondary | ICD-10-CM

## 2016-11-03 DIAGNOSIS — N451 Epididymitis: Secondary | ICD-10-CM

## 2016-11-03 MED ORDER — OXYCODONE-ACETAMINOPHEN 5-325 MG PO TABS
1.0000 | ORAL_TABLET | Freq: Once | ORAL | Status: AC
  Administered 2016-11-03: 1 via ORAL

## 2016-11-03 MED ORDER — LEVOFLOXACIN 500 MG PO TABS
500.0000 mg | ORAL_TABLET | Freq: Every day | ORAL | 0 refills | Status: AC
Start: 2016-11-03 — End: 2016-11-13

## 2016-11-03 MED ORDER — OXYCODONE-ACETAMINOPHEN 5-325 MG PO TABS
ORAL_TABLET | ORAL | Status: AC
  Filled 2016-11-03: qty 1

## 2016-11-03 MED ORDER — OXYCODONE-ACETAMINOPHEN 5-325 MG PO TABS: 1.0000 | ORAL_TABLET | Freq: Four times a day (QID) | ORAL | 0 refills | Status: AC | PRN

## 2016-11-03 MED ORDER — KETOROLAC TROMETHAMINE 30 MG/ML IJ SOLN
30.0000 mg | Freq: Once | INTRAMUSCULAR | Status: AC
  Administered 2016-11-04: 30 mg via INTRAMUSCULAR
  Filled 2016-11-03: qty 1

## 2016-11-03 MED ORDER — LEVOFLOXACIN 500 MG PO TABS
500.0000 mg | ORAL_TABLET | Freq: Once | ORAL | Status: AC
  Administered 2016-11-04: 500 mg via ORAL
  Filled 2016-11-03: qty 1

## 2016-11-03 MED ORDER — OXYCODONE-ACETAMINOPHEN 5-325 MG PO TABS
1.0000 | ORAL_TABLET | Freq: Once | ORAL | Status: AC
  Administered 2016-11-04: 1 via ORAL
  Filled 2016-11-03: qty 1

## 2016-11-03 NOTE — ED Notes (Signed)
Patient back from US.

## 2016-11-03 NOTE — ED Provider Notes (Signed)
[facility]-EMERGENCY DEPT Provider Note   CSN: [id number] Arrival date & time: 11/03/16  2008     History   Chief Complaint Chief Complaint  Patient presents with  . Testicle Pain    HPI Tommy Payne is a 51 y.o. male.  HPI  51 y.o. male, presents to the Emergency Department today from UC due to left scrotal swelling x 1 week. Pt states pain 10/10. TTP to area. Throbbing sensation. No meds PTA. Denies dysuria or discharge from urethra. Pt sexually active with wife only. No fevers. No CP/SOB/ABD pain. No redness around area. No other symptoms noted.    Past Medical History:  Diagnosis Date  . Acute pancreatitis 03/21/2015    Patient Active Problem List   Diagnosis Date Noted  . Pancreatitis 03/21/2015  . Pancreatitis, acute 03/21/2015  . Choledocholithiasis 03/21/2015  . Elevated blood pressure 03/21/2015  . Acute pancreatitis 03/21/2015  . Unspecified essential hypertension 06/28/2013    Past Surgical History:  Procedure Laterality Date  . CHOLECYSTECTOMY N/A 03/25/2015   Procedure: LAPAROSCOPIC CHOLECYSTECTOMY ;  Surgeon: Emelia LoronMatthew Wakefield, MD;  [facility];  Service: General;  Laterality: N/A;  . NO PAST SURGERIES         Home Medications    Prior to Admission medications   Medication Sig Start Date End Date Taking? Authorizing Provider  acetaminophen (TYLENOL) 500 MG tablet Take 1,000 mg by mouth every 6 (six) hours as needed for mild pain or headache.    [provider]  bismuth subsalicylate (PEPTO BISMOL) 262 MG/15ML suspension Take 30 mLs by mouth every 6 (six) hours as needed for indigestion or diarrhea or loose stools.    [provider]  carvedilol (COREG) 6.25 MG tablet Take 1 tablet (6.25 mg total) by mouth 2 (two) times daily with a meal. 03/26/15   Leroy SeaSingh, Prashant K, MD  docusate sodium (COLACE) 100 MG capsule Take 2 capsules (200 mg total) by mouth 2 (two) times daily as needed for mild constipation. 03/26/15   Leroy SeaSingh, Prashant K, MD   HYDROcodone-acetaminophen (NORCO/VICODIN) 5-325 MG tablet Take 1 tablet by mouth every 6 (six) hours as needed for moderate pain. 03/26/15   Leroy SeaSingh, Prashant K, MD  magnesium hydroxide (MILK OF MAGNESIA) 400 MG/5ML suspension Take 30 mLs by mouth daily as needed for mild constipation.    [provider]    Family History Family History  Problem Relation Age of Onset  . Hypertension Maternal Grandfather     Social History Social History  Substance Use Topics  . Smoking status: Current Every Day Smoker    Packs/day: 0.50    Years: 32.00    Types: Cigarettes  . Smokeless tobacco: Never Used  . Alcohol use 0.0 oz/week     Comment: 03/22/2015 "might drink a couple times/month; nothing regular"     Allergies   Patient has no known allergies.   Review of Systems Review of Systems ROS reviewed and all are negative for acute change except as noted in the HPI.  Physical Exam Updated Vital Signs BP (!) 163/90 (BP Location: Left Arm)   Pulse 87   Temp 99.2 F (37.3 C) (Oral)   Resp 18   SpO2 100%   Physical Exam  Constitutional: He is oriented to person, place, and time. Vital signs are normal. He appears well-developed and well-nourished.  HENT:  Head: Normocephalic and atraumatic.  Right Ear: Hearing normal.  Left Ear: Hearing normal.  Eyes: Pupils are equal, round, and reactive to light. Conjunctivae and  EOM are normal.  Cardiovascular: Normal rate, regular rhythm, normal heart sounds and intact distal pulses.   Pulmonary/Chest: Effort normal.  Genitourinary:  Genitourinary Comments: Chaperone present. Left scrotum with swelling noted. TTP below testicle. No erythema. No fluctuance. No discharge noted from urethra.    Neurological: He is alert and oriented to person, place, and time.  Skin: Skin is warm and dry.  Psychiatric: He has a normal mood and affect. His speech is normal and behavior is normal. Thought content normal.  Nursing note and vitals  reviewed.    ED Treatments / Results  Labs (all labs ordered are listed, but only abnormal results are displayed) Labs Reviewed - No data to display  EKG  EKG Interpretation None       Radiology US Scrotum  Result Date: 11/03/2016 CLINICAL DATA:  Left-sided testicular pain x5 days EXAM: SCROTAL ULTRASOUND DOPPLER ULTRASOUND OF THE TESTICLES TECHNIQUE: Complete ultrasound examination of the testicles, epididymis, and other scrotal structures was performed. Color and spectral Doppler ultrasound were also utilized to evaluate blood flow to the testicles. COMPARISON:  None. FINDINGS: Right testicle Measurements: 3.9 x 1.9 x 3.1 cm. No mass. A few microliths are noted. Left testicle Measurements: 4.1 x 3 x 3.3 cm. No mass or microlithiasis visualized. Increased vascularity of the left testicle is seen. Right epididymis: Normal with small 4 x 3 x 4 mm cyst noted at the head. Left epididymis: Hyperemic epididymis with mild enlargement measuring 4 x 1 x 3 cm. Hydrocele:  Small left hydrocele. Varicocele:  None visualized. Pulsed Doppler interrogation of both testes demonstrates normal low resistance arterial and venous waveforms bilaterally. IMPRESSION: 1. Hyperemic appearance of the left epididymis and testicle consistent with epididymo-orchitis. No evidence of testicular torsion. Small left hydrocele. 2. 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 Current literature suggests that testicular microlithiasis is not a significant independent risk factor for development of testicular carcinoma, and that follow up imaging is not warranted in the absence of other risk factors. Monthly testicular self-examination and annual physical exams are considered appropriate surveillance. If patient has other risk factors for testicular carcinoma, then referral to Urology should be considered. (Reference: DeCastro, et al.: A 5-Year Follow up Study of Asymptomatic Men with Testicular Microlithiasis. J Urol 2008;  179:1420-1423.) 3. Small right epididymal cyst measuring 4 x 3 x 4 mm. Electronically Signed   By: Tollie Eth M.D.   On: 11/03/2016 21:41   [place] Art/ven Flow Abd Pelv Doppler  Result Date: 11/03/2016 CLINICAL DATA:  Left-sided testicular pain x5 days EXAM: SCROTAL ULTRASOUND DOPPLER ULTRASOUND OF THE TESTICLES TECHNIQUE: Complete ultrasound examination of the testicles, epididymis, and other scrotal structures was performed. Color and spectral Doppler ultrasound were also utilized to evaluate blood flow to the testicles. COMPARISON:  None. FINDINGS: Right testicle Measurements: 3.9 x 1.9 x 3.1 cm. No mass. A few microliths are noted. Left testicle Measurements: 4.1 x 3 x 3.3 cm. No mass or microlithiasis visualized. Increased vascularity of the left testicle is seen. Right epididymis: Normal with small 4 x 3 x 4 mm cyst noted at the head. Left epididymis: Hyperemic epididymis with mild enlargement measuring 4 x 1 x 3 cm. Hydrocele:  Small left hydrocele. Varicocele:  None visualized. Pulsed Doppler interrogation of both testes demonstrates normal low resistance arterial and venous waveforms bilaterally. IMPRESSION: 1. Hyperemic appearance of the left epididymis and testicle consistent with epididymo-orchitis. No evidence of testicular torsion. Small left hydrocele. 2. 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 Current  literature suggests that testicular microlithiasis is not a significant independent risk factor for development of testicular carcinoma, and that follow up imaging is not warranted in the absence of other risk factors. Monthly testicular self-examination and annual physical exams are considered appropriate surveillance. If patient has other risk factors for testicular carcinoma, then referral to Urology should be considered. (Reference: DeCastro, et al.: A 5-Year Follow up Study of Asymptomatic Men with Testicular Microlithiasis. J Urol 2008; 179:1420-1423.) 3. Small right epididymal cyst  measuring 4 x 3 x 4 mm. Electronically Signed   By: Tollie Eth M.D.   On: 11/03/2016 21:41    Procedures Procedures (including critical care time)  Medications Ordered in ED Medications  oxyCODONE-acetaminophen (PERCOCET/ROXICET) 5-325 MG per tablet (not administered)  oxyCODONE-acetaminophen (PERCOCET/ROXICET) 5-325 MG per tablet 1 tablet (1 tablet Oral Given 11/03/16 2043)     Initial Impression / Assessment and Plan / ED Course  I have reviewed the triage vital signs and the nursing notes.  Pertinent labs & imaging results that were available during my care of the patient were reviewed by me and considered in my medical decision making (see chart for details).  Final Clinical Impressions(s) / ED Diagnoses   {I have reviewed and evaluated the relevant imaging studies.  {I have reviewed the relevant previous healthcare records.  {I obtained HPI from historian.   ED Course:  Assessment: Pt is a 51 y.o. male presents to the Emergency Department today from UC due to left scrotal swelling x 1 week. Pt states pain 10/10. TTP to area. Throbbing sensation. No meds PTA. Denies dysuria or discharge from urethra. Pt sexually active with wife only. No fevers. No CP/SOB/ABD pain. No redness around area. On exam, pt in NAD. Nontoxic/nonseptic appearing. VSS. Afebrile.  Left scrotum with swelling noted. TTP below testicle. No erythema. No fluctuance. No discharge noted from urethra. . Scrotal US shows epidymitis/orchitis. No torsion. Given analgesia and Levaquin in ED. Plan is to DC home with follow up to Urology. I have reviewed the [place] Controlled Substance Reporting System. Given rx Percocet. At time of discharge, Patient is in no acute distress. Vital Signs are stable. Patient is able to ambulate. Patient able to tolerate PO.    Disposition/Plan:  DC Home Additional Verbal discharge instructions given and discussed with patient.  Pt Instructed to f/u with Urology in the next week for  evaluation and treatment of symptoms. Return precautions given Pt acknowledges and agrees with plan  Supervising Physician Devoria Albe, MD  Final diagnoses:  Pain  Epididymitis    New Prescriptions New Prescriptions   No medications on file       Audry Pili, Cordelia Poche 11/03/16 2349    Devoria Albe, MD 11/04/16 [id number]

## 2016-11-03 NOTE — Discharge Instructions (Signed)
Please read and follow all provided instructions.  Your diagnoses today include:  1. Epididymitis   2. Pain     Tests performed today include: Vital signs. See below for your results today.   Medications prescribed:  Take as prescribed   Home care instructions:  Follow any educational materials contained in this packet.  Follow-up instructions: Please follow-up with your Urology for further evaluation of symptoms and treatment   Return instructions:  Please return to the Emergency Department if you do not get better, if you get worse, or new symptoms OR  - Fever (temperature greater than 101.6F)  - Bleeding that does not stop with holding pressure to the area    -Severe pain (please note that you may be more sore the day after your accident)  - Chest Pain  - Difficulty breathing  - Severe nausea or vomiting  - Inability to tolerate food and liquids  - Passing out  - Skin becoming red around your wounds  - Change in mental status (confusion or lethargy)  - New numbness or weakness    Please return if you have any other emergent concerns.  Additional Information:  Your vital signs today were: BP (!) 163/90 (BP Location: Left Arm)    Pulse 87    Temp 99.2 F (37.3 C) (Oral)    Resp 18    SpO2 100%  If your blood pressure (BP) was elevated above 135/85 this visit, please have this repeated by your doctor within one month. ---------------

## 2016-11-03 NOTE — ED Triage Notes (Signed)
Pt presents to ED for [id number] where he was sent for 1 week of left scrotal swelling with left LQ pain, tenderness on palpation.

## 2016-11-03 NOTE — ED Notes (Signed)
Pt reports testicular pain since Sunday. Pt denies any discharge or problems urinating.

## 2017-04-19 IMAGING — CT CT ABD-PELV W/ CM
2 of 5 series · 15 of 46 positions shown, 17 images · IV contrast (Omni 300)
Comparison: Ultrasound dated 03/21/2015

CLINICAL DATA: 49-year-old male with lower abdominal pain and
diaphoresis. No nausea or vomiting.

EXAM:
CT ABDOMEN AND PELVIS WITH CONTRAST
TECHNIQUE: Multidetector CT imaging of the abdomen and pelvis was performed
using the standard protocol following bolus administration of
intravenous contrast.
CONTRAST:  100mL OMNIPAQUE IOHEXOL 300 MG/ML  SOLN

[Series 2: a/p w/ 5mm · axial · 0.79mm/px · z∈[-480,-30]mm · 12 of 102 slices shown, 14 images]
[im 6/102  soft-tissue]
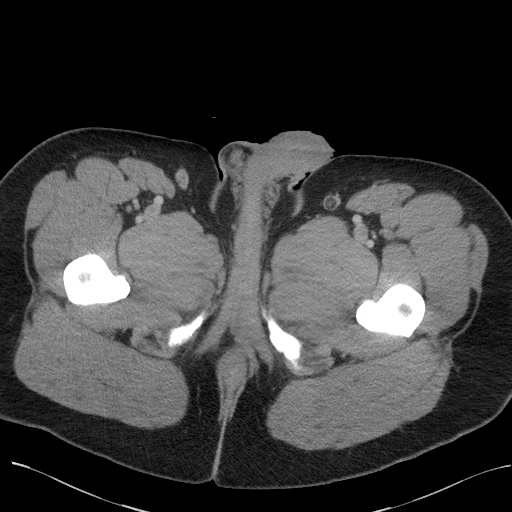
[im 6/102  bone]
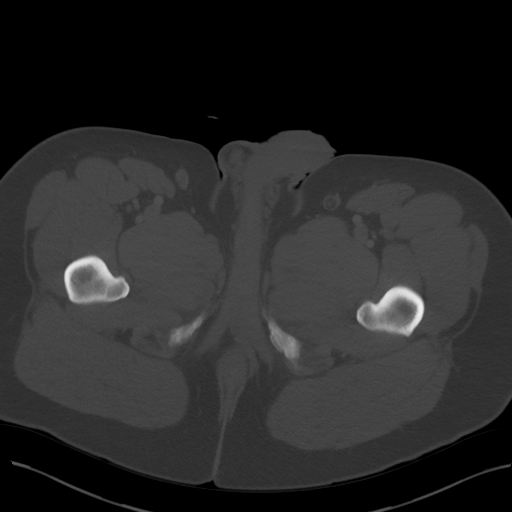
[im 17/102  soft-tissue]
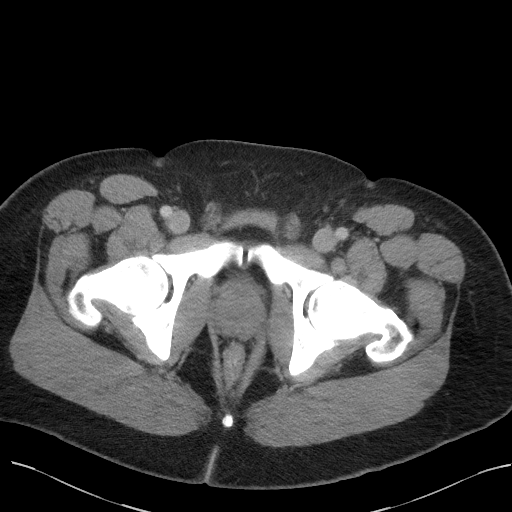
[im 23/102  soft-tissue]
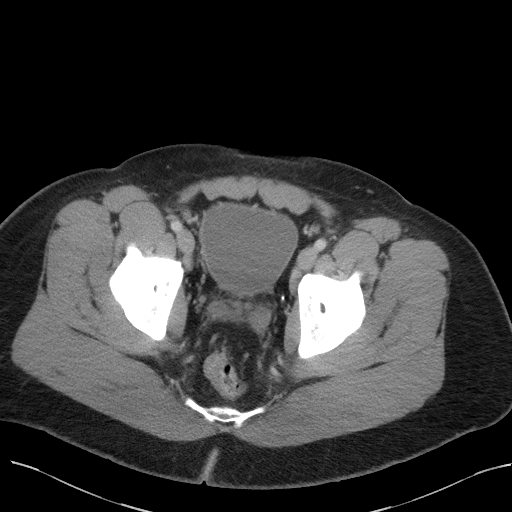
[im 29/102  soft-tissue]
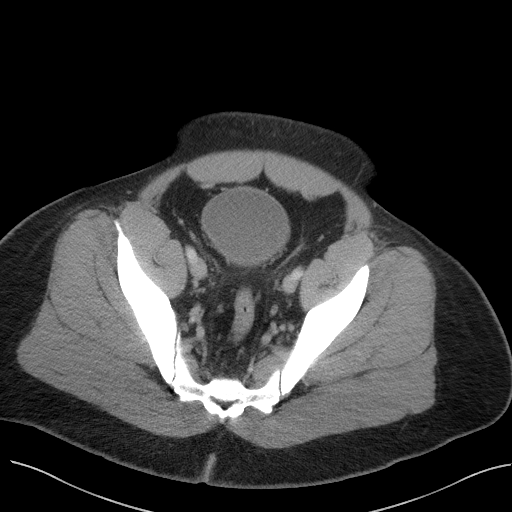
[im 40/102  soft-tissue]
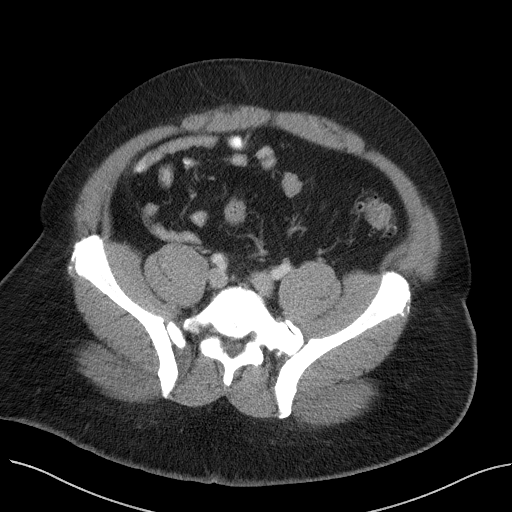
[im 45/102  soft-tissue]
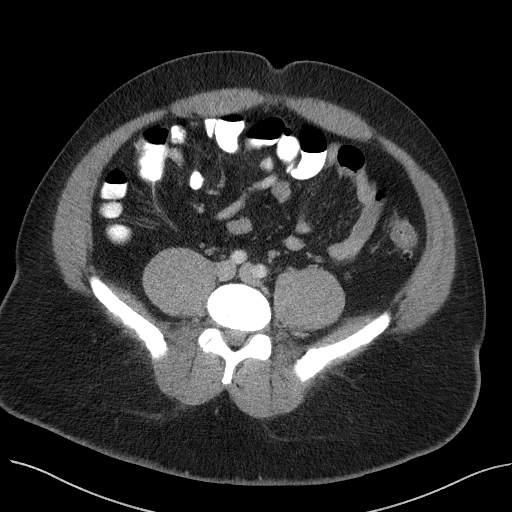
[im 57/102  soft-tissue]
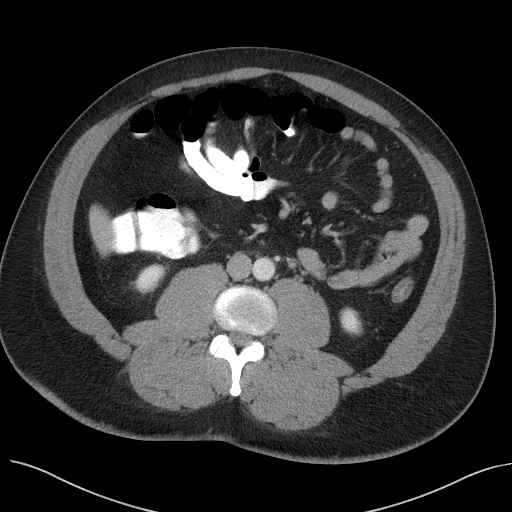
[im 62/102  soft-tissue]
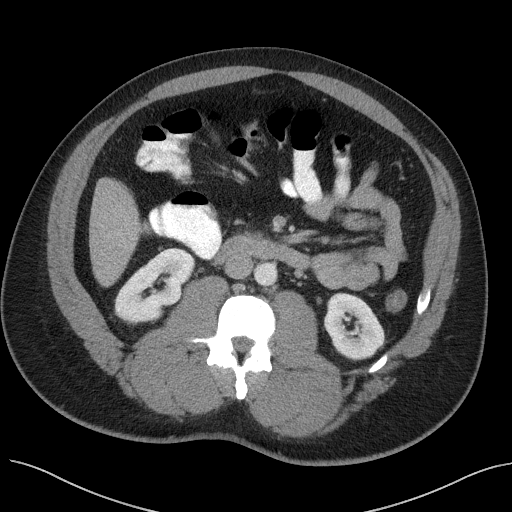
[im 73/102  soft-tissue]
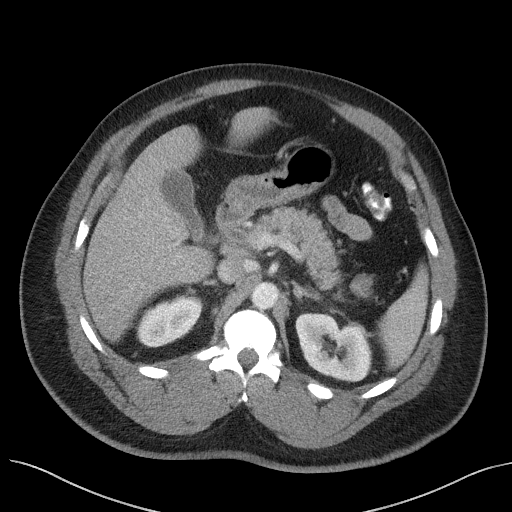
[im 73/102  bone]
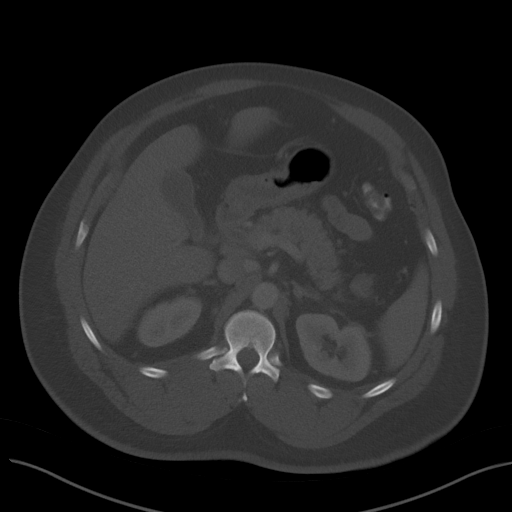
[im 79/102  soft-tissue]
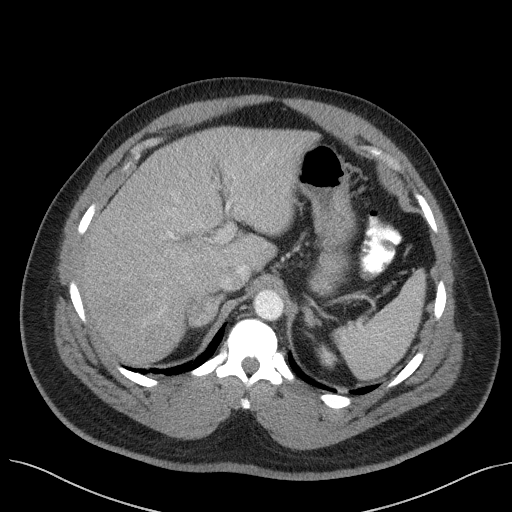
[im 85/102  soft-tissue]
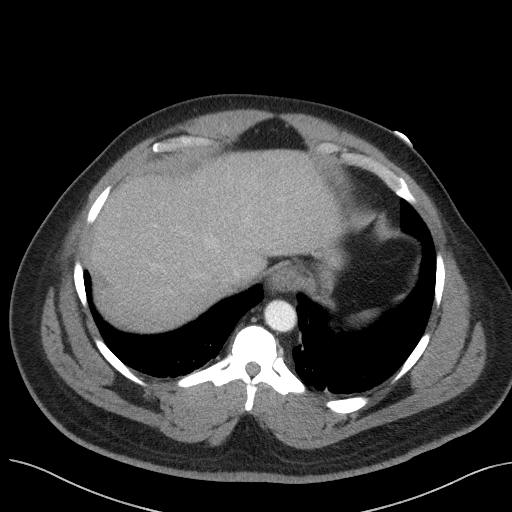
[im 96/102  soft-tissue]
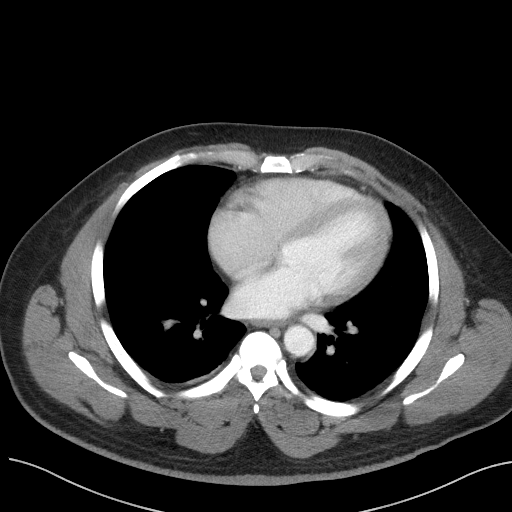

[Series 5: a/p w/ cor · coronal · 0.88mm/px · 3 of 155 slices shown]
[im 52/155  soft-tissue]
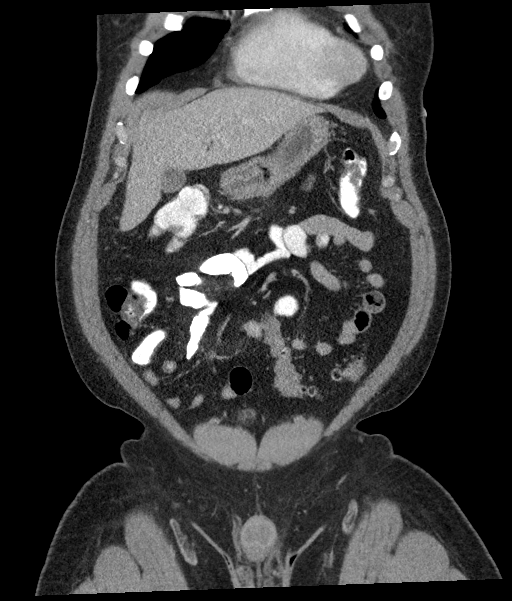
[im 69/155  soft-tissue]
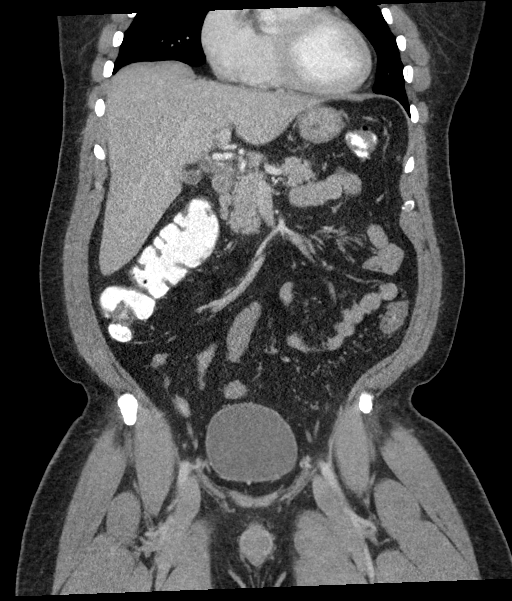
[im 86/155  soft-tissue]
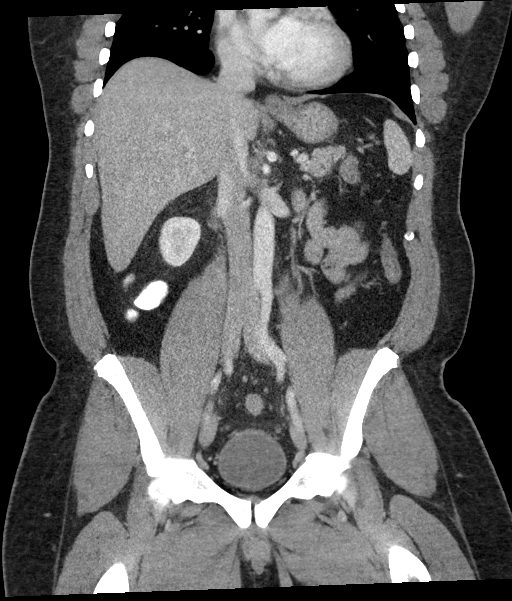

[15 of 46 positions shown; findings below may reference images not displayed]

FINDINGS: Minimal linear bibasilar dependent subsegmental atelectatic changes.
The lung bases are otherwise clear. Top-normal cardiac size. No
intra-abdominal free air or free fluid.

The liver is unremarkable. There is faint haziness within the
gallbladder fundus which may correspond to the stone seen on the
ultrasound. There is mild dilatation of the CBD measuring up to 12
mm in diameter with normal distal tapering at the head of the
pancreas. There is haziness of the wall of the biliary tree with
haziness of adjacent fat. This is likely related to extension of the
inflammatory changes of the pancreas. Sign An
inflammatory/infectious etiology involving the gallbladder or
biliary tree is not excluded. Clinical correlation is recommended.
There is mild peripancreatic haziness. This finding in combination
with elevated lipase level is compatible with acute pancreatitis. No
drainable fluid collection/abscess or pseudocyst identified. The
spleen, and the left adrenal gland appear unremarkable. There is a
2.7 x 2.6 cm indeterminate heterogeneously enhancing right adrenal
nodule. MRI is recommended for further characterization. There is a
1.3 cm nonobstructing left renal lower pole calculus. No
hydronephrosis. The right kidney appears unremarkable.

The visualized ureters and urinary bladder appear unremarkable. The
prostate and seminal vesicles are grossly unremarkable.

There is sigmoid diverticulosis with muscular hypertrophy. No active
inflammation. There is apparent diffuse thickening of the distal
colon, likely related to underdistention. Colitis is less likely but
not excluded. Clinical correlation is recommended. No evidence of
bowel obstruction. Normal appendix.

The abdominal aorta and IVC appear unremarkable. No portal venous
gas identified. There is no adenopathy. There is a small fat
containing umbilical hernia. The abdominal wall soft tissues appear
unremarkable. The osseous structures are intact.
IMPRESSION: Findings most compatible with acute pancreatitis.  No abscess.

Mild haziness of the biliary trees likely extension of inflammation
of the pancreas. An inflammatory/infectious involving the biliary
tree as the primary pathology is less likely but not excluded.
Clinical correlation is recommended.

Indeterminate right adrenal nodule. MRI is recommended further
characterization.

Nonobstructing left renal inferior pole calculus.

Sigmoid diverticulosis.

## 2017-04-20 IMAGING — MR MR 3D RECON AT SCANNER
19 series · 19 of 19 positions shown · IV contrast (multihance)
Comparison: CT scan 03/21/2015 and ultrasound abdomen 03/21/2015.

CLINICAL DATA: Acute pancreatitis

EXAM:
MRI ABDOMEN WITHOUT AND WITH CONTRAST (INCLUDING MRCP)
TECHNIQUE: Multiplanar multisequence MR imaging of the abdomen was performed
both before and after the administration of intravenous contrast.
Heavily T2-weighted images of the biliary and pancreatic ducts were
obtained, and three-dimensional MRCP images were rendered by post
processing.
CONTRAST:  20mL MULTIHANCE GADOBENATE DIMEGLUMINE 529 MG/ML IV SOLN

[Series 4: T2 fat-sat · axial · 5.0mm · 0.78mm/px · 1 of 62 slices shown (1 of 2)]
[im 1/62]
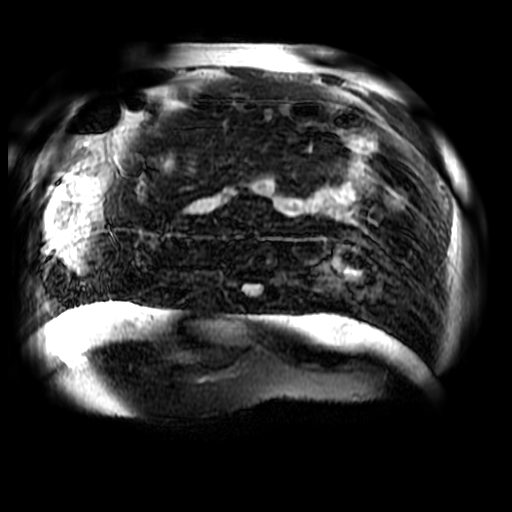

[Series 6: T2 · axial · 5.0mm · 0.78mm/px · 1 of 61 slices shown (1 of 2)]
[im 1/61]
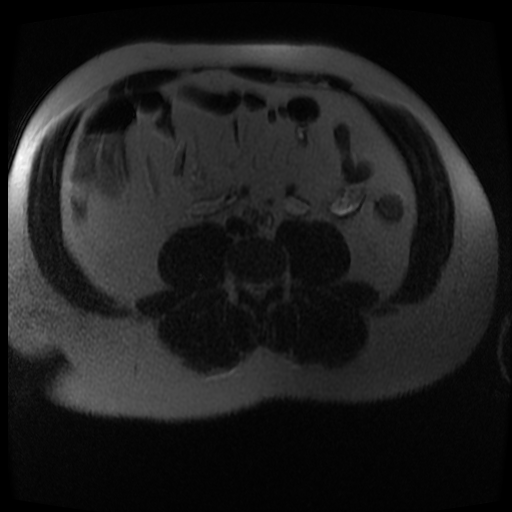

[Series 7: T2 fat-sat · axial · 6.0mm · 0.82mm/px · 1 of 36 slices shown (2 of 2)]
[im 1/36]
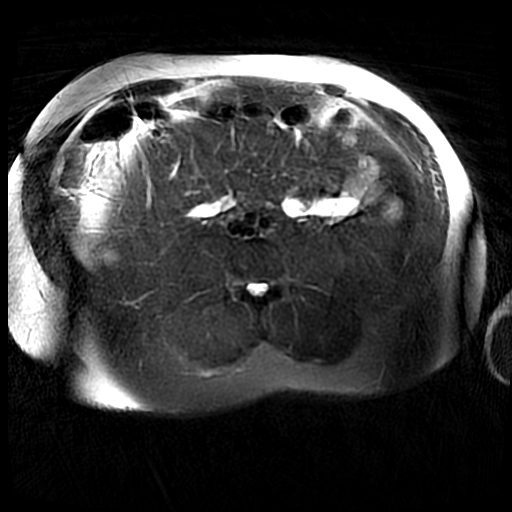

[Series 10: T2 · coronal · 5.0mm · 0.82mm/px · 1 of 55 slices shown (2 of 2)]
[im 1/55]
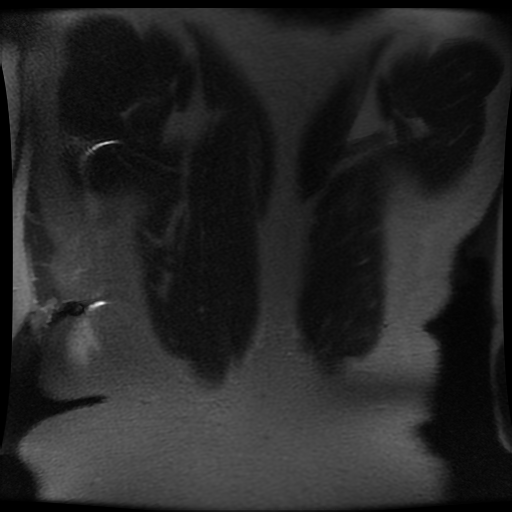

[Series 12: DWI b500 · axial · 6.0mm · 1.48mm/px · 1 of 78 slices shown]
[im 1/78]
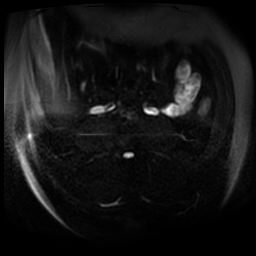

[Series 15: MRCP · coronal · 2.0mm · 0.70mm/px · 1 of 22 slices shown (1 of 3)]
[im 1/22]
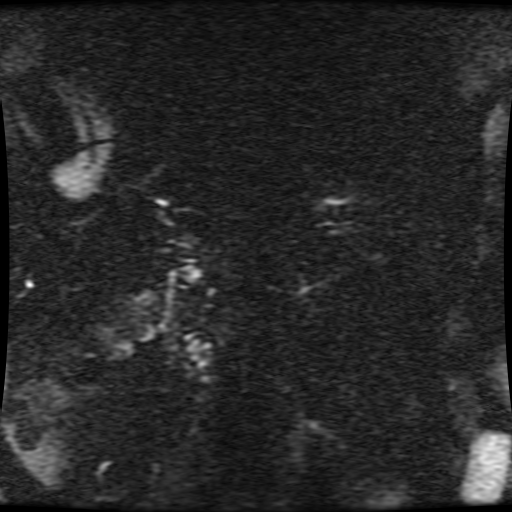

[Series 16: MRCP · coronal · 1.6mm · 0.62mm/px · 1 of 128 slices shown (2 of 3)]
[im 1/128]
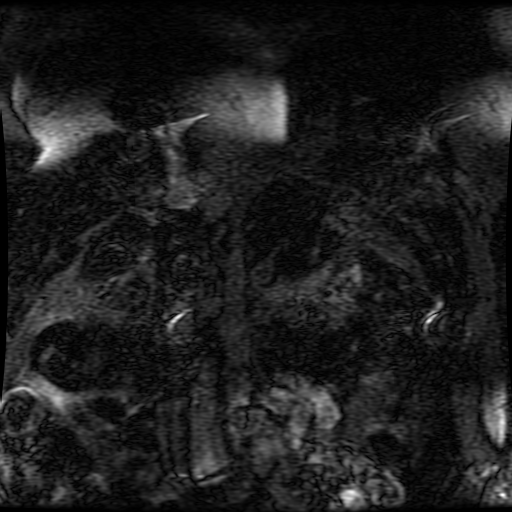

[Series 20: ax dualecho · axial · 5.0mm · 0.78mm/px · 1 of 122 slices shown]
[im 1/122]
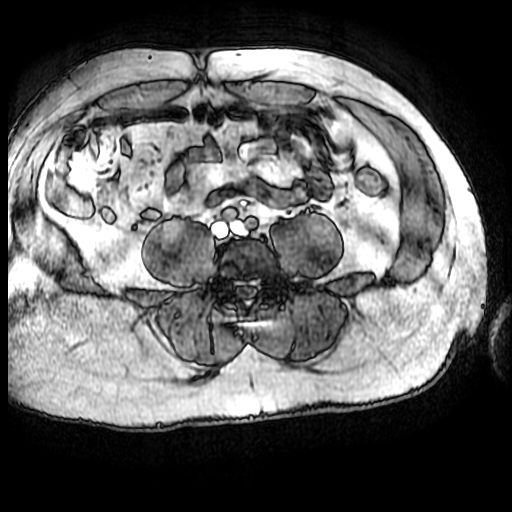

[Series 21: MRCP · sagittal · 40.0mm · 0.70mm/px · 1 of 12 slices shown (3 of 3)]
[im 1/12]
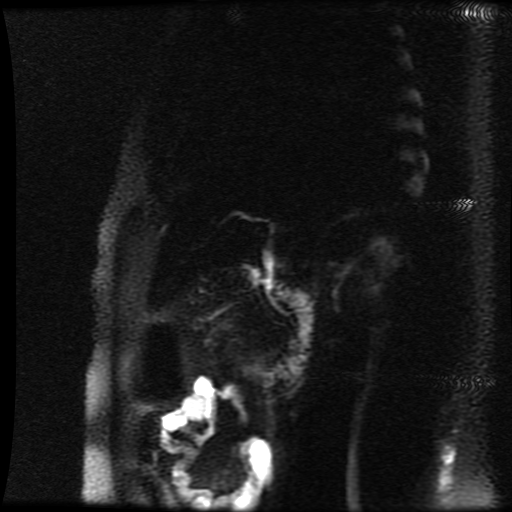

[Series 25: T1 dynamic post-contrast · coronal · 5.0mm · 0.78mm/px · 1 of 88 slices shown]
[im 1/88]
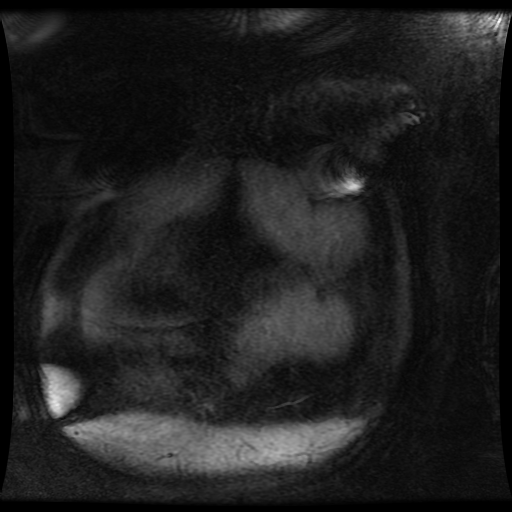

[Series 1200: DWI · axial · 6.0mm · 1.48mm/px · 1 of 38 slices shown (1 of 2)]
[im 1/38]
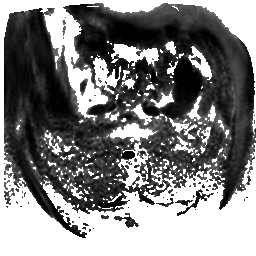

[Series 1201: DWI · axial · 6.0mm · 1.48mm/px · 1 of 38 slices shown (2 of 2)]
[im 1/38]
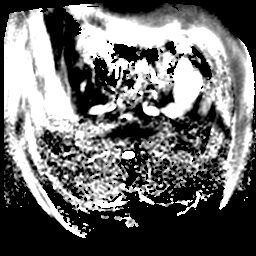

[Series 1601: processed images · coronal · 1.6mm · 0.62mm/px · 1 of 7 slices shown (1 of 2)]
[im 1/7]
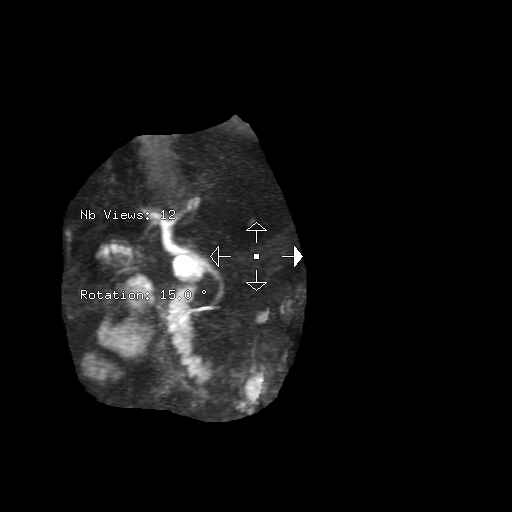

[Series 1602: processed images · axial · 1.6mm · 0.62mm/px · 1 of 8 slices shown (2 of 2)]
[im 1/8]
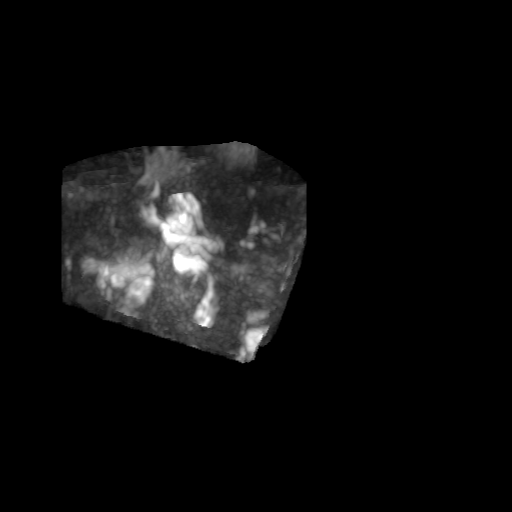

[Series 2300: T1 dynamic · axial · 5.0mm · 0.78mm/px · 1 of 112 slices shown (1 of 5)]
[im 1/112]
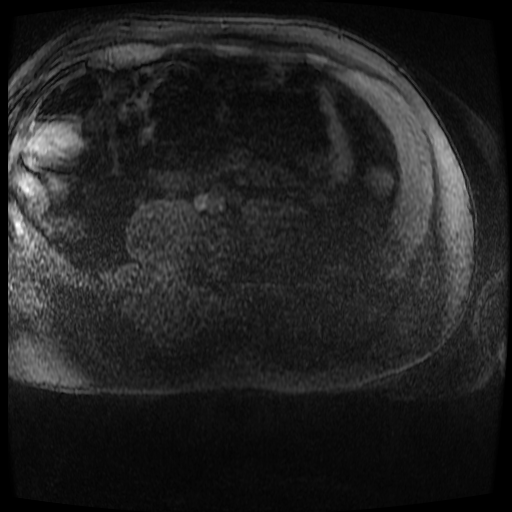

[Series 2301: T1 dynamic · axial · 5.0mm · 0.78mm/px · 1 of 112 slices shown (2 of 5)]
[im 1/112]
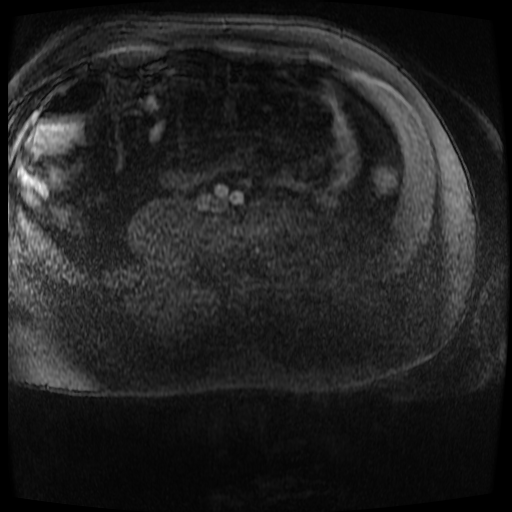

[Series 2302: T1 dynamic · axial · 5.0mm · 0.78mm/px · 1 of 104 slices shown (3 of 5)]
[im 1/104]
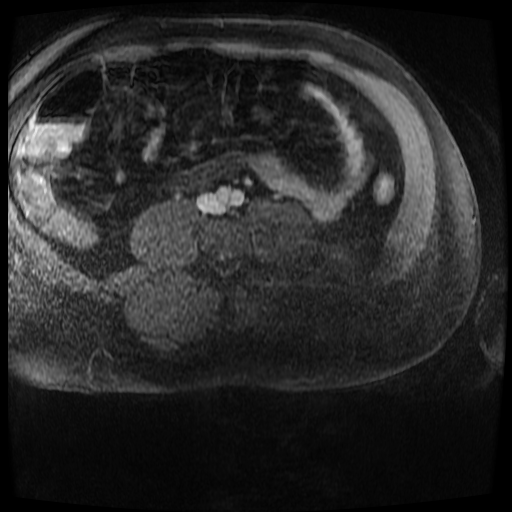

[Series 2303: T1 dynamic · axial · 5.0mm · 0.78mm/px · 1 of 112 slices shown (4 of 5)]
[im 1/112]
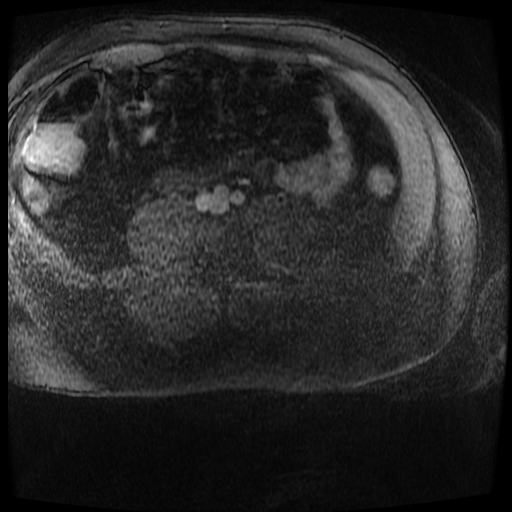

[Series 2304: T1 dynamic · axial · 5.0mm · 0.78mm/px · 1 of 112 slices shown (5 of 5)]
[im 1/112]
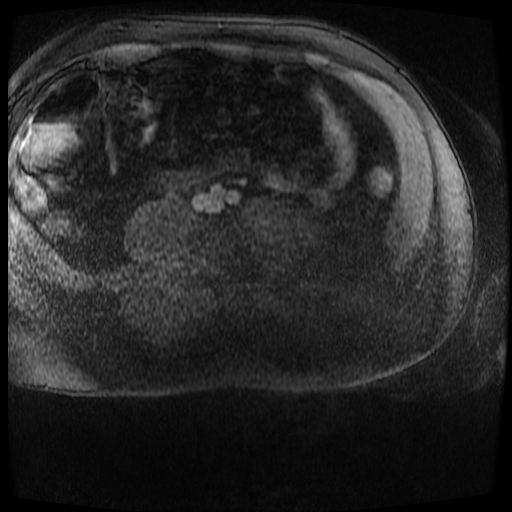

[19 of 19 positions shown; findings below may reference images not displayed]

FINDINGS: Examination is limited by breathing motion artifact.

Lower chest: Minimal dependent bibasilar atelectasis. No pleural
effusion. The heart is upper limits of normal in size. No
pericardial effusion. The distal esophagus is grossly normal.

Hepatobiliary: No focal hepatic lesions or intrahepatic biliary
dilatation. The common bile duct is mildly dilated in the porta
hepatis at 7 mm. It tapers normally in the pancreatic head. No
common bile duct stones are identified. The gallbladder is filled
with small gallstones. No findings for acute cholecystitis.

Pancreas: No mass or ductal dilatation. Mild diffuse inflammation.
Normal pancreatic enhancement. No peripancreatic fluid collections.

Spleen: Normal size.  No focal lesions.

Adrenals/Urinary Tract: Right adrenal gland lesion as noted on the
CT scan demonstrates signal loss on the out of phase T1 weighted
gradient echo sequence consistent with a benign adenoma. The left
adrenal gland is normal.

Both kidneys are grossly normal.

Stomach/Bowel: The stomach, duodenum, visualized small bowel and
visualized colon are grossly normal. No inflammatory changes, mass
lesions or obstructive findings.

Vascular/Lymphatic: Small scattered mesenteric and retroperitoneal
lymph nodes but no mass or adenopathy. The aorta and branch vessels
are normal.

Other: No ascites or worrisome fluid collections.

Musculoskeletal: No significant bony findings.
IMPRESSION: 1. Examination is limited by motion artifact.
2. Cholelithiasis without findings for acute cholecystitis.
3. Borderline common bile duct dilatation in the porta hepatis but
normal in the head of the pancreas. No definite obstructing common
bile duct stones are identified.
4. Mild inflammation of the pancreatic head. No peripancreatic fluid
collections or pancreatic necrosis.

## 2019-05-18 IMAGING — US US SCROTUM
1 series · 13 of 25 positions shown · non-contrast
Comparison: None.

CLINICAL DATA: Left-sided testicular pain x5 days

EXAM:
SCROTAL ULTRASOUND
DOPPLER ULTRASOUND OF THE TESTICLES
TECHNIQUE: Complete ultrasound examination of the testicles, epididymis, and
other scrotal structures was performed. Color and spectral Doppler
ultrasound were also utilized to evaluate blood flow to the
testicles.

[Series 1: us scrotum · 0.06mm/px · 13 of 76 slices shown]
[im 1/76]
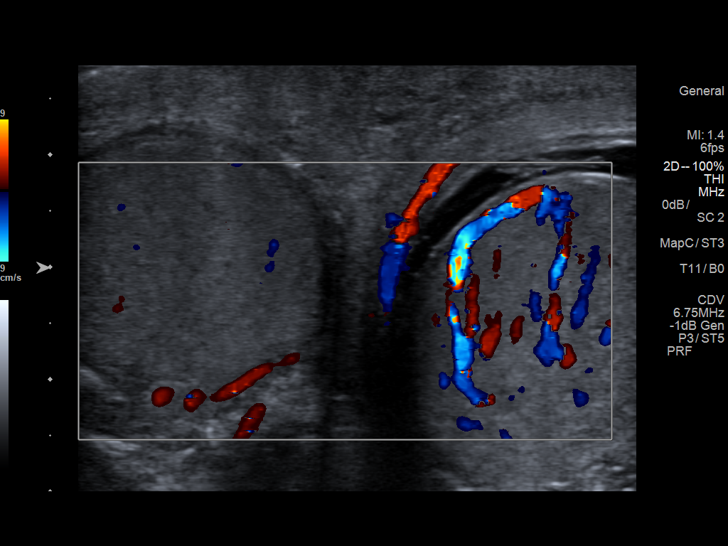
[im 7/76]
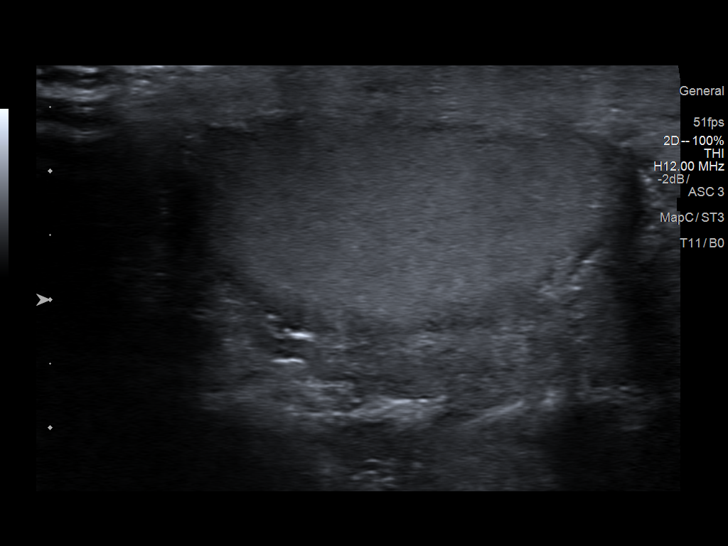
[im 13/76]
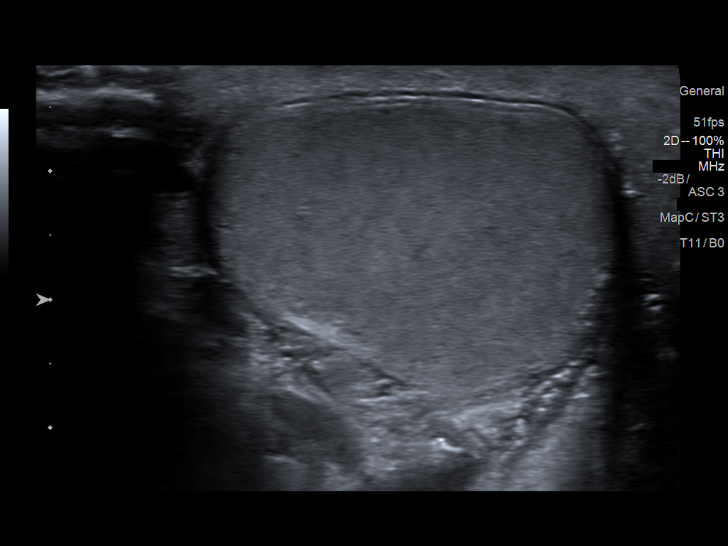
[im 19/76]
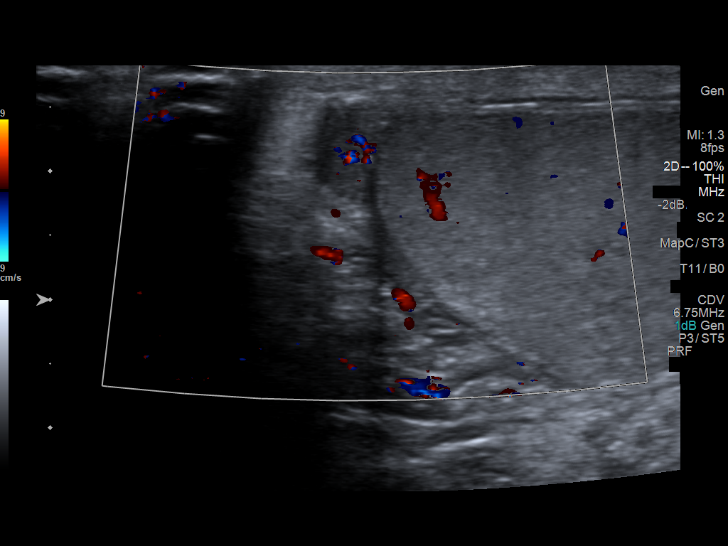
[im 26/76]
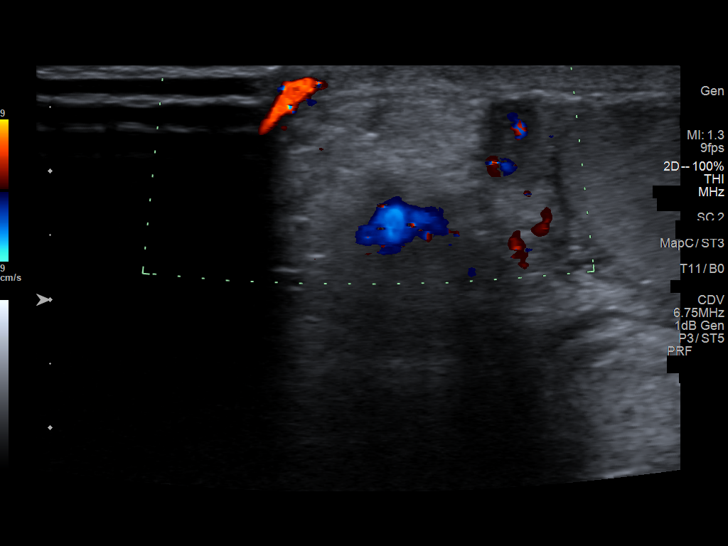
[im 32/76]
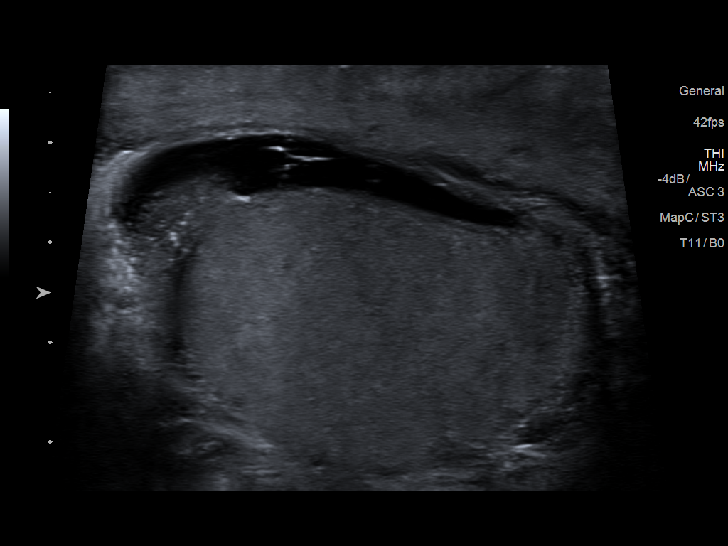
[im 38/76]
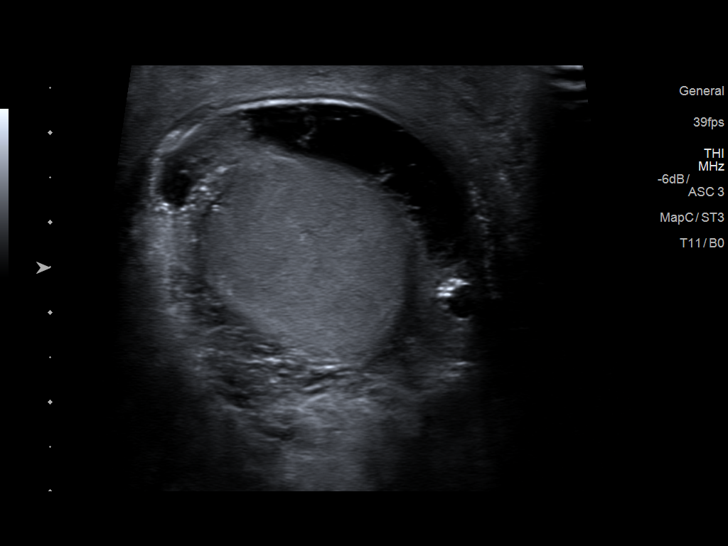
[im 44/76]
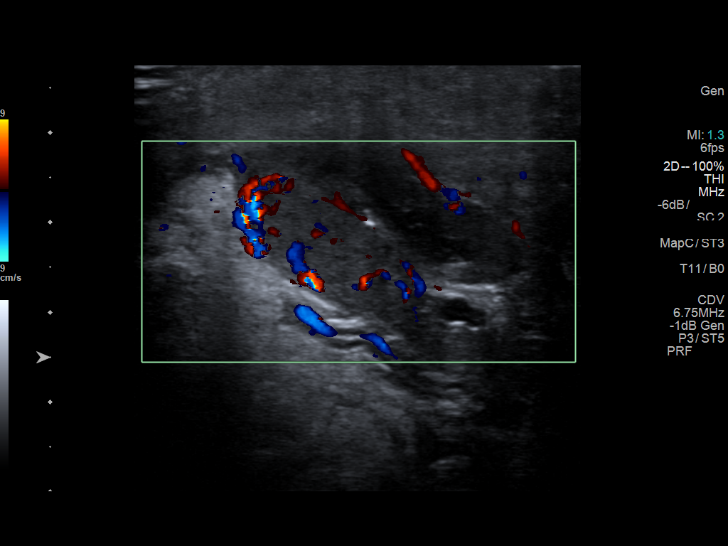
[im 51/76]
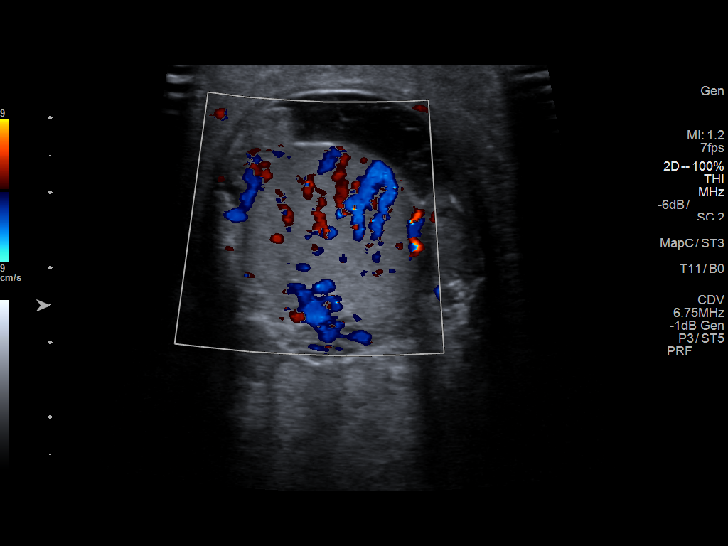
[im 57/76]
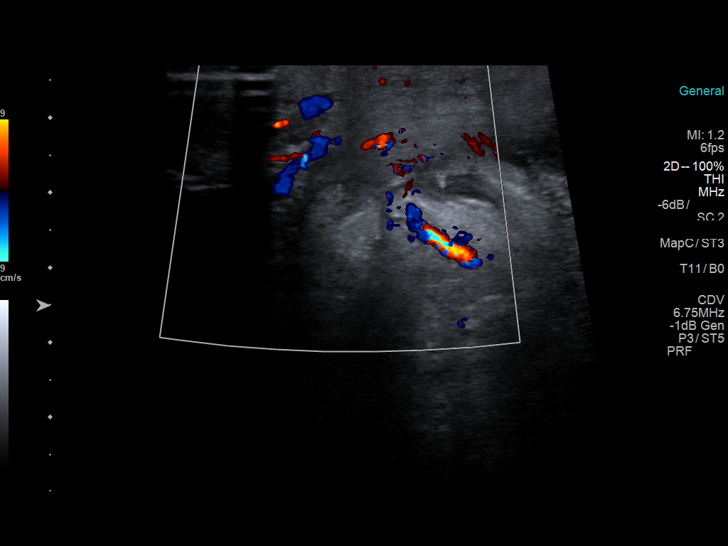
[im 63/76]
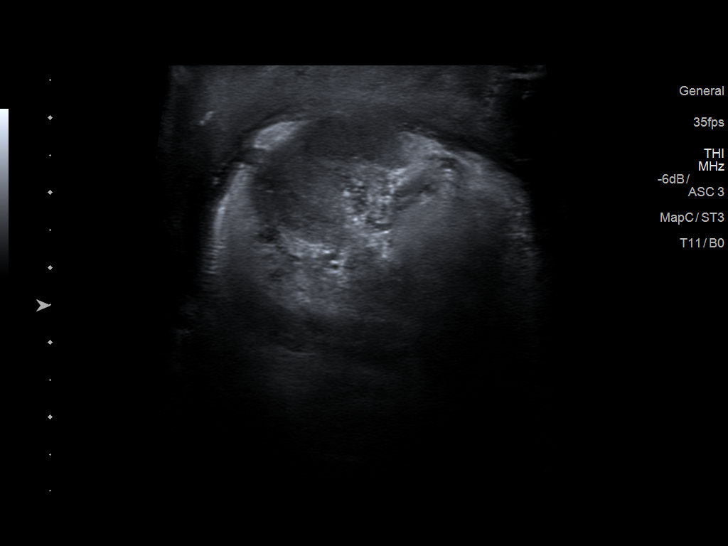
[im 69/76]
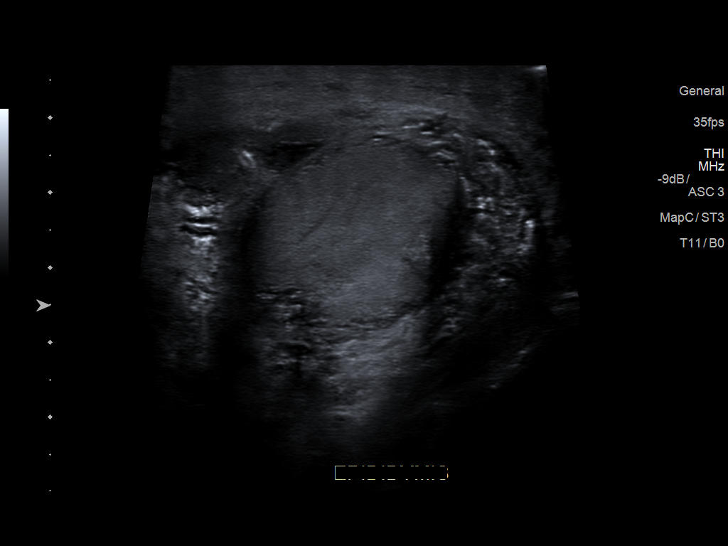
[im 76/76]
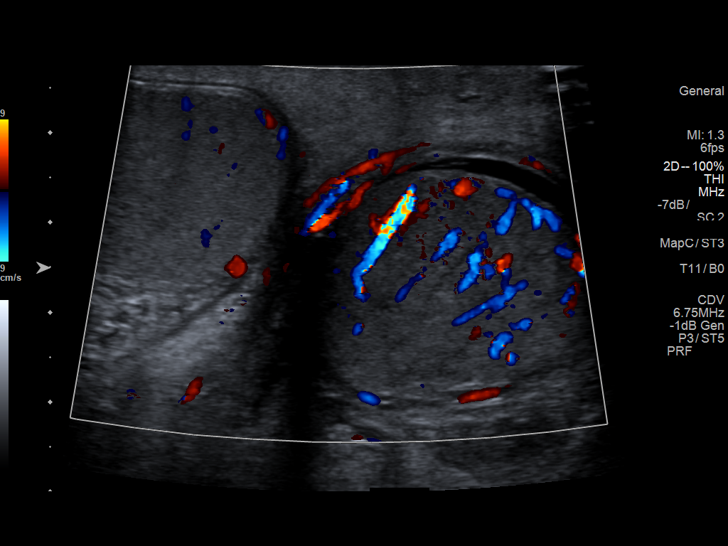

[13 of 25 positions shown; findings below may reference images not displayed]

FINDINGS: Right testicle

Measurements: 3.9 x 1.9 x 3.1 cm. No mass. A few microliths are
noted.

Left testicle

Measurements: 4.1 x 3 x 3.3 cm. No mass or microlithiasis
visualized. Increased vascularity of the left testicle is seen.

Right epididymis: Normal with small 4 x 3 x 4 mm cyst noted at the
head.

Left epididymis: Hyperemic epididymis with mild enlargement
measuring 4 x 1 x 3 cm.

Hydrocele:  Small left hydrocele.

Varicocele:  None visualized.

Pulsed Doppler interrogation of both testes demonstrates normal low
resistance arterial and venous waveforms bilaterally.
IMPRESSION: 1. Hyperemic appearance of the left epididymis and testicle
consistent with epididymo-orchitis. No evidence of testicular
torsion. Small left hydrocele.
2. A few testicular microliths are seen the right. Current
literature suggests that testicular microlithiasis is not a
significant independent risk factor for development of testicular
carcinoma, and that follow up imaging is not warranted in the
absence of other risk factors. Monthly testicular self-examination
and annual physical exams are considered appropriate surveillance.
If patient has other risk factors for testicular carcinoma, then
referral to Urology should be considered. (Reference: Neftaly, et
al.: A 5-Year Follow up Study of Asymptomatic Men with Testicular
Microlithiasis. J Urol 6883; [id number].)
3. Small right epididymal cyst measuring 4 x 3 x 4 mm.
